# Patient Record
Sex: Male | Born: 1970 | Hispanic: Yes | Marital: Married | Smoking: Former smoker
Health system: Southern US, Community
[De-identification: ages and names within clinical notes are randomized; demographics above are authoritative.]

## PROBLEM LIST (undated history)

## (undated) ENCOUNTER — Emergency Department: Discharge status: Absent without leave | Payer: BLUE CROSS/BLUE SHIELD

## (undated) DIAGNOSIS — E119 Type 2 diabetes mellitus without complications: Secondary | ICD-10-CM

## (undated) DIAGNOSIS — I10 Essential (primary) hypertension: Secondary | ICD-10-CM

## (undated) HISTORY — DX: Essential (primary) hypertension: I10

## (undated) HISTORY — DX: Type 2 diabetes mellitus without complications: E11.9

---

## 2014-08-16 ENCOUNTER — Emergency Department: Payer: Self-pay | Admitting: Emergency Medicine

## 2017-07-20 ENCOUNTER — Ambulatory Visit: Payer: BLUE CROSS/BLUE SHIELD | Attending: Orthopaedic Surgery

## 2017-07-20 DIAGNOSIS — M25512 Pain in left shoulder: Secondary | ICD-10-CM | POA: Diagnosis present

## 2017-07-20 DIAGNOSIS — M25511 Pain in right shoulder: Secondary | ICD-10-CM | POA: Insufficient documentation

## 2017-07-20 DIAGNOSIS — M6281 Muscle weakness (generalized): Secondary | ICD-10-CM | POA: Diagnosis present

## 2017-07-20 DIAGNOSIS — M545 Low back pain: Secondary | ICD-10-CM | POA: Insufficient documentation

## 2017-07-20 DIAGNOSIS — M542 Cervicalgia: Secondary | ICD-10-CM | POA: Diagnosis present
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
## 2017-07-20 NOTE — Patient Instructions (Signed)
(  Home) Extension: Thoracic With Lumbar Lock - Sitting    Chair against the wall.  Sit with back against chair, knees bent, hands folded across (not shown). Extend trunk over chair back. Hold position for __5__ seconds. Repeat ___10  times per set. Do _3___ sets per session daily.   Copyright  VHI. All rights reserved.   La silla contra la pared Sientese con la espalda recostada en el espaldar de la silla, rodillas medio dobladas y las manos crusadas sobre los hombros. sostener la posicion por 5 segundos. repetir 10 veces y 3 seciones al dia.

## 2017-07-25 ENCOUNTER — Ambulatory Visit: Payer: BLUE CROSS/BLUE SHIELD

## 2017-07-25 DIAGNOSIS — M545 Low back pain: Secondary | ICD-10-CM

## 2017-07-25 DIAGNOSIS — M25512 Pain in left shoulder: Secondary | ICD-10-CM

## 2017-07-25 DIAGNOSIS — M6281 Muscle weakness (generalized): Secondary | ICD-10-CM

## 2017-07-25 DIAGNOSIS — M542 Cervicalgia: Secondary | ICD-10-CM

## 2017-07-25 DIAGNOSIS — M25511 Pain in right shoulder: Secondary | ICD-10-CM
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
## 2017-07-25 NOTE — Patient Instructions (Signed)
Scapular Retraction: Rowing (Eccentric) -    Hold end of band in each hand. Pull back until elbows are even with trunk. Hold for 5 seconds.  Use __yellow______ resistance band. _10__ reps per set, _3__ sets per day. Copyright  VHI. All rights reserved.     Sostenga la punta de la banda en cada mano. Jale hacia atras The St. Paul Travelershasta que los codos queden [parejos con el torso. Sostenga por 5 segundos. Use la banda de resistencia amarilla, repita 10 veces tres veces al dia.

## 2017-07-28 ENCOUNTER — Ambulatory Visit: Payer: BLUE CROSS/BLUE SHIELD

## 2017-07-28 DIAGNOSIS — M25512 Pain in left shoulder: Secondary | ICD-10-CM

## 2017-07-28 DIAGNOSIS — M545 Low back pain: Secondary | ICD-10-CM | POA: Diagnosis not present

## 2017-07-28 DIAGNOSIS — M542 Cervicalgia: Secondary | ICD-10-CM

## 2017-07-28 DIAGNOSIS — M6281 Muscle weakness (generalized): Secondary | ICD-10-CM

## 2017-07-28 DIAGNOSIS — M25511 Pain in right shoulder: Secondary | ICD-10-CM
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
## 2017-07-28 NOTE — Patient Instructions (Signed)
Abduction: Side Leg Lift (Eccentric) - Side-Lying    Lie on side. Lift top leg slightly higher than shoulder level. Keep top leg straight with body, toes pointing forward.  __10_ reps per set, __3_ sets per day, _5__ days per week.  http://ecce.exer.us/62   Copyright  VHI. All rights reserved.  Acuestese sobre un lado. Levante la pierna que esta derecha mas alto que el nivel del hombro. Mantenga la pierna que esta derecha alineada con su cuerpo. Los dedos del pie apuntando hacia afuera. Repita diez veces cada lado. Tres veces por dia , 5 veces por semana.

## 2017-08-01 ENCOUNTER — Ambulatory Visit: Payer: BLUE CROSS/BLUE SHIELD

## 2017-08-01 DIAGNOSIS — M545 Low back pain: Secondary | ICD-10-CM

## 2017-08-01 DIAGNOSIS — M6281 Muscle weakness (generalized): Secondary | ICD-10-CM

## 2017-08-01 DIAGNOSIS — M25511 Pain in right shoulder: Secondary | ICD-10-CM

## 2017-08-01 DIAGNOSIS — M25512 Pain in left shoulder: Secondary | ICD-10-CM

## 2017-08-01 DIAGNOSIS — M542 Cervicalgia: Secondary | ICD-10-CM
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
## 2017-08-01 NOTE — Patient Instructions (Signed)
Resisted External Rotation: in Neutral - Bilateral    Sit or stand, band in both hands, elbows at sides, bent to 90, forearms forward. Pinch shoulder blades together and rotate forearms out. Keep elbows at sides. Repeat _10___ times per set. Do _1___ sets per session. Do __3__ sessions per day.  http://orth.exer.us/966   Copyright  VHI. All rights reserved.   sientese or pare, con la Bed Bath [employer] Beyondbanda en ambas manos, con los codos a los lados y doblados a 90 grados, con los antebrazos hacia adelante.ponga las omoplatas juntas y ruede los antebrazos hacia afuera. mantenga los codos al lado. Repita 10 vezes por grupo. Un grupo por vez y 3 vezes al dia.

## 2017-08-03 ENCOUNTER — Emergency Department: Payer: BLUE CROSS/BLUE SHIELD | Admitting: Anesthesiology

## 2017-08-03 ENCOUNTER — Other Ambulatory Visit: Payer: Self-pay

## 2017-08-03 ENCOUNTER — Emergency Department: Payer: BLUE CROSS/BLUE SHIELD

## 2017-08-03 ENCOUNTER — Inpatient Hospital Stay
Admission: EM | Admit: 2017-08-03 | Discharge: 2017-08-04 | DRG: 030 | Disposition: A | Discharge status: Other Acute Inpatient Hospital | Payer: BLUE CROSS/BLUE SHIELD | Attending: Neurosurgery | Admitting: Neurosurgery

## 2017-08-03 ENCOUNTER — Encounter
Admission: EM | Disposition: A | Discharge status: Other Acute Inpatient Hospital | Payer: Self-pay | Source: Home / Self Care | Attending: Neurosurgery

## 2017-08-03 DIAGNOSIS — E119 Type 2 diabetes mellitus without complications: Secondary | ICD-10-CM | POA: Diagnosis present

## 2017-08-03 DIAGNOSIS — Z79899 Other long term (current) drug therapy: Secondary | ICD-10-CM

## 2017-08-03 DIAGNOSIS — G834 Cauda equina syndrome: Principal | ICD-10-CM | POA: Diagnosis present

## 2017-08-03 DIAGNOSIS — Z7984 Long term (current) use of oral hypoglycemic drugs: Secondary | ICD-10-CM | POA: Diagnosis not present

## 2017-08-03 DIAGNOSIS — Z87891 Personal history of nicotine dependence: Secondary | ICD-10-CM | POA: Diagnosis not present

## 2017-08-03 DIAGNOSIS — Z419 Encounter for procedure for purposes other than remedying health state, unspecified: Secondary | ICD-10-CM

## 2017-08-03 DIAGNOSIS — R339 Retention of urine, unspecified: Secondary | ICD-10-CM | POA: Diagnosis present

## 2017-08-03 DIAGNOSIS — I10 Essential (primary) hypertension: Secondary | ICD-10-CM | POA: Diagnosis present

## 2017-08-03 HISTORY — PX: LUMBAR LAMINECTOMY/DECOMPRESSION MICRODISCECTOMY: SHX5026

## 2017-08-03 LAB — CBC WITH DIFFERENTIAL/PLATELET
Basophils Absolute: 0.1 10*3/uL (ref 0–0.1)
Basophils Relative: 1 %
EOS ABS: 0 10*3/uL (ref 0–0.7)
Eosinophils Relative: 0 %
HEMATOCRIT: 41.9 % (ref 40.0–52.0)
HEMOGLOBIN: 13.8 g/dL (ref 13.0–18.0)
LYMPHS ABS: 1.1 10*3/uL (ref 1.0–3.6)
LYMPHS PCT: 18 %
MCH: 28.1 pg (ref 26.0–34.0)
MCHC: 32.8 g/dL (ref 32.0–36.0)
MCV: 85.5 fL (ref 80.0–100.0)
Monocytes Absolute: 0.5 10*3/uL (ref 0.2–1.0)
Monocytes Relative: 7 %
NEUTROS PCT: 74 %
Neutro Abs: 4.7 10*3/uL (ref 1.4–6.5)
Platelets: 187 10*3/uL (ref 150–440)
RBC: 4.9 MIL/uL (ref 4.40–5.90)
RDW: 13.5 % (ref 11.5–14.5)
WBC: 6.4 10*3/uL (ref 3.8–10.6)

## 2017-08-03 LAB — COMPREHENSIVE METABOLIC PANEL
ALT: 13 U/L — ABNORMAL LOW (ref 17–63)
AST: 23 U/L (ref 15–41)
Albumin: 4.3 g/dL (ref 3.5–5.0)
Alkaline Phosphatase: 52 U/L (ref 38–126)
Anion gap: 16 — ABNORMAL HIGH (ref 5–15)
BUN: 12 mg/dL (ref 6–20)
CHLORIDE: 95 mmol/L — AB (ref 101–111)
CO2: 23 mmol/L (ref 22–32)
Calcium: 8.9 mg/dL (ref 8.9–10.3)
Creatinine, Ser: 0.63 mg/dL (ref 0.61–1.24)
Glucose, Bld: 182 mg/dL — ABNORMAL HIGH (ref 65–99)
POTASSIUM: 3.8 mmol/L (ref 3.5–5.1)
SODIUM: 134 mmol/L — AB (ref 135–145)
Total Bilirubin: 1.4 mg/dL — ABNORMAL HIGH (ref 0.3–1.2)
Total Protein: 7.6 g/dL (ref 6.5–8.1)

## 2017-08-03 LAB — PROTIME-INR
INR: 1.04
PROTHROMBIN TIME: 13.5 s (ref 11.4–15.2)

## 2017-08-03 LAB — TYPE AND SCREEN
ABO/RH(D): A POS
Antibody Screen: NEGATIVE

## 2017-08-03 LAB — GLUCOSE, CAPILLARY
GLUCOSE-CAPILLARY: 173 mg/dL — AB (ref 65–99)
Glucose-Capillary: 155 mg/dL — ABNORMAL HIGH (ref 65–99)

## 2017-08-03 LAB — LACTIC ACID, PLASMA: Lactic Acid, Venous: 1.1 mmol/L (ref 0.5–1.9)

## 2017-08-03 LAB — SEDIMENTATION RATE: SED RATE: 6 mm/h (ref 0–15)

## 2017-08-03 LAB — C-REACTIVE PROTEIN

## 2017-08-03 SURGERY — LUMBAR LAMINECTOMY/DECOMPRESSION MICRODISCECTOMY 3 LEVELS
Anesthesia: General | Site: Back | Wound class: Clean

## 2017-08-03 MED ORDER — MIDAZOLAM HCL 2 MG/2ML IJ SOLN
INTRAMUSCULAR | Status: DC | PRN
  Administered 2017-08-03: 1 mg via INTRAVENOUS

## 2017-08-03 MED ORDER — KETOROLAC TROMETHAMINE 60 MG/2ML IM SOLN
60.0000 mg | Freq: Once | INTRAMUSCULAR | Status: AC
  Administered 2017-08-03: 60 mg via INTRAMUSCULAR
  Filled 2017-08-03: qty 2

## 2017-08-03 MED ORDER — METHOCARBAMOL 500 MG PO TABS
500.0000 mg | ORAL_TABLET | Freq: Four times a day (QID) | ORAL | Status: DC | PRN
  Filled 2017-08-03 (×2): qty 1

## 2017-08-03 MED ORDER — THROMBIN (RECOMBINANT) 5000 UNITS EX SOLR
CUTANEOUS | Status: DC | PRN
Start: 2017-08-03 — End: 2017-08-03
  Administered 2017-08-03: 5000 [IU] via TOPICAL

## 2017-08-03 MED ORDER — ROCURONIUM BROMIDE 100 MG/10ML IV SOLN
INTRAVENOUS | Status: DC | PRN
  Administered 2017-08-03: 10 mg via INTRAVENOUS
  Administered 2017-08-03: 40 mg via INTRAVENOUS
  Administered 2017-08-03: 20 mg via INTRAVENOUS

## 2017-08-03 MED ORDER — SODIUM CHLORIDE 0.9 % IV SOLN: 250.0000 mL | INTRAVENOUS | Status: DC

## 2017-08-03 MED ORDER — INSULIN ASPART 100 UNIT/ML ~~LOC~~ SOLN: 0.0000 [IU] | Freq: Three times a day (TID) | SUBCUTANEOUS | Status: DC

## 2017-08-03 MED ORDER — METFORMIN HCL 500 MG PO TABS
1000.0000 mg | ORAL_TABLET | Freq: Two times a day (BID) | ORAL | Status: DC
  Administered 2017-08-04 (×2): 1000 mg via ORAL
  Filled 2017-08-03 (×2): qty 2

## 2017-08-03 MED ORDER — MIDAZOLAM HCL 2 MG/2ML IJ SOLN
INTRAMUSCULAR | Status: AC
  Filled 2017-08-03: qty 2

## 2017-08-03 MED ORDER — ACETAMINOPHEN 325 MG PO TABS
650.0000 mg | ORAL_TABLET | ORAL | Status: DC | PRN
Start: 2017-08-03 — End: 2017-08-05

## 2017-08-03 MED ORDER — ONDANSETRON HCL 4 MG PO TABS: 4.0000 mg | ORAL_TABLET | Freq: Four times a day (QID) | ORAL | Status: DC | PRN

## 2017-08-03 MED ORDER — OXYCODONE HCL 5 MG PO TABS
10.0000 mg | ORAL_TABLET | ORAL | Status: DC | PRN
  Administered 2017-08-03: 10 mg via ORAL
  Filled 2017-08-03: qty 2

## 2017-08-03 MED ORDER — BUPIVACAINE-EPINEPHRINE (PF) 0.5% -1:200000 IJ SOLN
INTRAMUSCULAR | Status: DC | PRN
  Administered 2017-08-03: 20 mL

## 2017-08-03 MED ORDER — ONDANSETRON HCL 4 MG/2ML IJ SOLN: 4.0000 mg | Freq: Four times a day (QID) | INTRAMUSCULAR | Status: DC | PRN

## 2017-08-03 MED ORDER — LIDOCAINE HCL (CARDIAC) 20 MG/ML IV SOLN
INTRAVENOUS | Status: DC | PRN
  Administered 2017-08-03: 30 mg via INTRAVENOUS

## 2017-08-03 MED ORDER — FENTANYL CITRATE (PF) 100 MCG/2ML IJ SOLN
INTRAMUSCULAR | Status: AC
  Administered 2017-08-03: 25 ug via INTRAVENOUS
  Filled 2017-08-03: qty 2

## 2017-08-03 MED ORDER — SENNOSIDES-DOCUSATE SODIUM 8.6-50 MG PO TABS: 1.0000 | ORAL_TABLET | Freq: Every evening | ORAL | Status: DC | PRN

## 2017-08-03 MED ORDER — PHENOL 1.4 % MT LIQD
1.0000 | OROMUCOSAL | Status: DC | PRN
  Filled 2017-08-03: qty 177

## 2017-08-03 MED ORDER — OXYCODONE HCL 5 MG PO TABS
5.0000 mg | ORAL_TABLET | ORAL | Status: DC | PRN
  Administered 2017-08-04 (×3): 5 mg via ORAL
  Filled 2017-08-03 (×3): qty 1

## 2017-08-03 MED ORDER — ROCURONIUM BROMIDE 50 MG/5ML IV SOLN
INTRAVENOUS | Status: AC
  Filled 2017-08-03: qty 1

## 2017-08-03 MED ORDER — DIPHENHYDRAMINE HCL 25 MG PO CAPS
25.0000 mg | ORAL_CAPSULE | ORAL | Status: DC | PRN
  Administered 2017-08-04: 25 mg via ORAL
  Filled 2017-08-03 (×2): qty 1

## 2017-08-03 MED ORDER — ACETAMINOPHEN 500 MG PO TABS
1000.0000 mg | ORAL_TABLET | Freq: Four times a day (QID) | ORAL | Status: AC
  Administered 2017-08-04 (×4): 1000 mg via ORAL
  Filled 2017-08-03 (×4): qty 2

## 2017-08-03 MED ORDER — METHOCARBAMOL 1000 MG/10ML IJ SOLN
500.0000 mg | Freq: Four times a day (QID) | INTRAMUSCULAR | Status: DC | PRN
  Filled 2017-08-03: qty 5

## 2017-08-03 MED ORDER — ACETAMINOPHEN 650 MG RE SUPP: 650.0000 mg | RECTAL | Status: DC | PRN

## 2017-08-03 MED ORDER — SODIUM CHLORIDE 0.9 % IV SOLN
INTRAVENOUS | Status: DC | PRN
  Administered 2017-08-03: 30 ug/min via INTRAVENOUS

## 2017-08-03 MED ORDER — FENTANYL CITRATE (PF) 100 MCG/2ML IJ SOLN
INTRAMUSCULAR | Status: DC | PRN
  Administered 2017-08-03: 100 ug via INTRAVENOUS

## 2017-08-03 MED ORDER — PHENYLEPHRINE HCL 10 MG/ML IJ SOLN
INTRAMUSCULAR | Status: DC | PRN
  Administered 2017-08-03 (×3): 100 ug via INTRAVENOUS

## 2017-08-03 MED ORDER — PROPOFOL 10 MG/ML IV BOLUS
INTRAVENOUS | Status: DC | PRN
  Administered 2017-08-03: 150 mg via INTRAVENOUS

## 2017-08-03 MED ORDER — SODIUM CHLORIDE 0.9% FLUSH
3.0000 mL | Freq: Two times a day (BID) | INTRAVENOUS | Status: DC
  Administered 2017-08-03 – 2017-08-04 (×2): 3 mL via INTRAVENOUS

## 2017-08-03 MED ORDER — HYDROMORPHONE HCL 1 MG/ML IJ SOLN
1.0000 mg | Freq: Once | INTRAMUSCULAR | Status: AC
  Administered 2017-08-03: 1 mg via INTRAMUSCULAR
  Filled 2017-08-03: qty 1

## 2017-08-03 MED ORDER — HYDROMORPHONE HCL 1 MG/ML IJ SOLN
INTRAMUSCULAR | Status: AC
  Filled 2017-08-03: qty 1

## 2017-08-03 MED ORDER — GELATIN ABSORBABLE 12-7 MM EX MISC
CUTANEOUS | Status: DC | PRN
  Administered 2017-08-03: 3

## 2017-08-03 MED ORDER — INSULIN GLARGINE 100 UNIT/ML ~~LOC~~ SOLN
0.0000 [IU] | Freq: Every day | SUBCUTANEOUS | Status: DC
  Filled 2017-08-03: qty 0.2

## 2017-08-03 MED ORDER — HYDROMORPHONE HCL 1 MG/ML IJ SOLN
0.5000 mg | INTRAMUSCULAR | Status: DC | PRN
  Administered 2017-08-03 – 2017-08-04 (×4): 0.5 mg via INTRAVENOUS
  Filled 2017-08-03 (×3): qty 1

## 2017-08-03 MED ORDER — SUGAMMADEX SODIUM 200 MG/2ML IV SOLN
INTRAVENOUS | Status: DC | PRN
  Administered 2017-08-03: 120 mg via INTRAVENOUS

## 2017-08-03 MED ORDER — LABETALOL HCL 5 MG/ML IV SOLN
10.0000 mg | Freq: Once | INTRAVENOUS | Status: AC
  Administered 2017-08-03: 10 mg via INTRAVENOUS
  Filled 2017-08-03: qty 4

## 2017-08-03 MED ORDER — CEFAZOLIN SODIUM-DEXTROSE 1-4 GM/50ML-% IV SOLN
INTRAVENOUS | Status: DC | PRN
  Administered 2017-08-03: 1 g via INTRAVENOUS

## 2017-08-03 MED ORDER — BUPIVACAINE HCL (PF) 0.5 % IJ SOLN
INTRAMUSCULAR | Status: DC | PRN
  Administered 2017-08-03: 2 mL

## 2017-08-03 MED ORDER — MENTHOL 3 MG MT LOZG
1.0000 | LOZENGE | OROMUCOSAL | Status: DC | PRN
  Filled 2017-08-03: qty 9

## 2017-08-03 MED ORDER — METHYLPREDNISOLONE ACETATE 40 MG/ML IJ SUSP
INTRAMUSCULAR | Status: DC | PRN
  Administered 2017-08-03: 40 mg

## 2017-08-03 MED ORDER — ENALAPRIL MALEATE 2.5 MG PO TABS
2.5000 mg | ORAL_TABLET | Freq: Every day | ORAL | Status: DC
  Administered 2017-08-04: 2.5 mg via ORAL
  Filled 2017-08-03: qty 1

## 2017-08-03 MED ORDER — PROPOFOL 10 MG/ML IV BOLUS
INTRAVENOUS | Status: AC
  Filled 2017-08-03: qty 20

## 2017-08-03 MED ORDER — BACITRACIN 50000 UNITS IM SOLR
INTRAMUSCULAR | Status: DC | PRN
  Administered 2017-08-03: 50000 [IU]

## 2017-08-03 MED ORDER — DIPHENHYDRAMINE HCL 25 MG PO CAPS: 25.0000 mg | ORAL_CAPSULE | ORAL | Status: DC | PRN

## 2017-08-03 MED ORDER — SODIUM CHLORIDE 0.9 % IV SOLN
INTRAVENOUS | Status: DC
  Administered 2017-08-03: via INTRAVENOUS

## 2017-08-03 MED ORDER — SODIUM CHLORIDE 0.9% FLUSH: 3.0000 mL | INTRAVENOUS | Status: DC | PRN

## 2017-08-03 MED ORDER — CANAGLIFLOZIN 100 MG PO TABS
100.0000 mg | ORAL_TABLET | Freq: Every day | ORAL | Status: DC
  Filled 2017-08-03 (×2): qty 1

## 2017-08-03 MED ORDER — SODIUM CHLORIDE 0.9 % IJ SOLN
INTRAMUSCULAR | Status: DC | PRN
  Administered 2017-08-03: 20 mL

## 2017-08-03 MED ORDER — BUPIVACAINE LIPOSOME 1.3 % IJ SUSP
INTRAMUSCULAR | Status: DC | PRN
  Administered 2017-08-03: 20 mL

## 2017-08-03 MED ORDER — FENTANYL CITRATE (PF) 100 MCG/2ML IJ SOLN
25.0000 ug | INTRAMUSCULAR | Status: DC | PRN
  Administered 2017-08-03 (×4): 25 ug via INTRAVENOUS

## 2017-08-03 MED ORDER — LACTATED RINGERS IV SOLN
INTRAVENOUS | Status: DC | PRN
  Administered 2017-08-03 (×2): via INTRAVENOUS

## 2017-08-03 MED ORDER — FENTANYL CITRATE (PF) 250 MCG/5ML IJ SOLN
INTRAMUSCULAR | Status: AC
  Filled 2017-08-03: qty 5

## 2017-08-03 MED ORDER — ONDANSETRON HCL 4 MG/2ML IJ SOLN
INTRAMUSCULAR | Status: DC | PRN
  Administered 2017-08-03: 4 mg via INTRAVENOUS

## 2017-08-03 MED ORDER — ONDANSETRON HCL 4 MG/2ML IJ SOLN: 4.0000 mg | Freq: Once | INTRAMUSCULAR | Status: DC | PRN

## 2017-08-03 SURGICAL SUPPLY — 60 items
BLADE BOVIE TIP EXT 4 (BLADE) ×3 IMPLANT
BUR NEURO DRILL SOFT 3.0X3.8M (BURR) ×3 IMPLANT
CANISTER SUCT 1200ML W/VALVE (MISCELLANEOUS) ×6 IMPLANT
CHLORAPREP W/TINT 26ML (MISCELLANEOUS) ×3 IMPLANT
CNTNR SPEC 2.5X3XGRAD LEK (MISCELLANEOUS) ×1
CONT SPEC 4OZ STER OR WHT (MISCELLANEOUS) ×2
CONTAINER SPEC 2.5X3XGRAD LEK (MISCELLANEOUS) ×1 IMPLANT
COUNTER NEEDLE 20/40 LG (NEEDLE) ×3 IMPLANT
COVER LIGHT HANDLE STERIS (MISCELLANEOUS) ×6 IMPLANT
CUP MEDICINE 2OZ PLAST GRAD ST (MISCELLANEOUS) ×6 IMPLANT
DERMABOND ADVANCED (GAUZE/BANDAGES/DRESSINGS) ×2
DERMABOND ADVANCED .7 DNX12 (GAUZE/BANDAGES/DRESSINGS) ×1 IMPLANT
DRAPE C-ARM 42X72 X-RAY (DRAPES) ×6 IMPLANT
DRAPE LAPAROTOMY 100X77 ABD (DRAPES) ×3 IMPLANT
DRAPE MICROSCOPE SPINE 48X150 (DRAPES) ×3 IMPLANT
DRAPE POUCH INSTRU U-SHP 10X18 (DRAPES) ×3 IMPLANT
DRAPE SURG 17X11 SM STRL (DRAPES) ×12 IMPLANT
DRSG TEGADERM 4X4.75 (GAUZE/BANDAGES/DRESSINGS) ×3 IMPLANT
DRSG TELFA 4X3 1S NADH ST (GAUZE/BANDAGES/DRESSINGS) ×3 IMPLANT
ELECT CAUTERY BLADE TIP 2.5 (TIP) ×3
ELECT EZSTD 165MM 6.5IN (MISCELLANEOUS) ×3
ELECTRODE CAUTERY BLDE TIP 2.5 (TIP) ×1 IMPLANT
ELECTRODE EZSTD 165MM 6.5IN (MISCELLANEOUS) ×1 IMPLANT
EVICEL AIRLESS SPRAY ACCES (MISCELLANEOUS) IMPLANT
FRAME EYE SHIELD (PROTECTIVE WEAR) ×3 IMPLANT
GLOVE BIOGEL M 6.5 STRL (GLOVE) ×6 IMPLANT
GLOVE BIOGEL PI IND STRL 7.0 (GLOVE) ×2 IMPLANT
GLOVE BIOGEL PI INDICATOR 7.0 (GLOVE) ×4
GLOVE SURG SYN 8.5  E (GLOVE) ×6
GLOVE SURG SYN 8.5 E (GLOVE) ×3 IMPLANT
GOWN SRG XL LVL 3 NONREINFORCE (GOWNS) ×1 IMPLANT
GOWN STRL NON-REIN TWL XL LVL3 (GOWNS) ×2
GOWN STRL REUS W/ TWL LRG LVL3 (GOWN DISPOSABLE) ×1 IMPLANT
GOWN STRL REUS W/TWL LRG LVL3 (GOWN DISPOSABLE) ×2
GRADUATE 1200CC STRL 31836 (MISCELLANEOUS) ×3 IMPLANT
KIT SPINAL PRONEVIEW (KITS) ×3 IMPLANT
KNIFE BAYONET SHORT DISCETOMY (MISCELLANEOUS) IMPLANT
MARKER SKIN DUAL TIP RULER LAB (MISCELLANEOUS) ×9 IMPLANT
NDL SAFETY ECLIPSE 18X1.5 (NEEDLE) ×1 IMPLANT
NEEDLE HYPO 18GX1.5 SHARP (NEEDLE) ×2
NEEDLE HYPO 22GX1.5 SAFETY (NEEDLE) ×3 IMPLANT
NS IRRIG 1000ML POUR BTL (IV SOLUTION) ×3 IMPLANT
PACK LAMINECTOMY NEURO (CUSTOM PROCEDURE TRAY) ×3 IMPLANT
PAD ARMBOARD 7.5X6 YLW CONV (MISCELLANEOUS) ×3 IMPLANT
PATTIES SURGICAL .5X1.5 (GAUZE/BANDAGES/DRESSINGS) IMPLANT
SPOGE SURGIFLO 8M (HEMOSTASIS) ×2
SPONGE SURGIFLO 8M (HEMOSTASIS) ×1 IMPLANT
STAPLER SKIN PROX 35W (STAPLE) IMPLANT
SUT DVC VLOC 3-0 CL 6 P-12 (SUTURE) ×3 IMPLANT
SUT NURALON 4 0 TR CR/8 (SUTURE) IMPLANT
SUT VIC AB 0 CT1 27 (SUTURE)
SUT VIC AB 0 CT1 27XCR 8 STRN (SUTURE) IMPLANT
SUT VIC AB 2-0 CT1 18 (SUTURE) ×9 IMPLANT
SUT VICRYL 0 AB UR-6 (SUTURE) ×6 IMPLANT
SYR 10ML LL (SYRINGE) ×3 IMPLANT
SYR 20CC LL (SYRINGE) ×3 IMPLANT
SYR 3ML LL SCALE MARK (SYRINGE) ×3 IMPLANT
TOWEL OR 17X26 4PK STRL BLUE (TOWEL DISPOSABLE) ×12 IMPLANT
TUBING CONNECTING 10 (TUBING) ×2 IMPLANT
TUBING CONNECTING 10' (TUBING) ×1

## 2017-08-03 NOTE — ED Notes (Addendum)
Report to Melody in OR.

## 2017-08-03 NOTE — Anesthesia Preprocedure Evaluation (Signed)
Anesthesia Evaluation  Patient identified by MRN, date of birth, ID band Patient awake    Reviewed: Allergy & Precautions, NPO status , Patient's Chart, lab work & pertinent test results  History of Anesthesia Complications Negative for: history of anesthetic complications  Airway Mallampati: II       Dental   Pulmonary neg sleep apnea, neg COPD, former smoker,           Cardiovascular hypertension, Pt. on medications (-) Past MI and (-) CHF (-) dysrhythmias (-) Valvular Problems/Murmurs     Neuro/Psych neg Seizures    GI/Hepatic Neg liver ROS, neg GERD  ,  Endo/Other  diabetes, Type 2, Oral Hypoglycemic Agents  Renal/GU negative Renal ROS     Musculoskeletal   Abdominal   Peds  Hematology   Anesthesia Other Findings   Reproductive/Obstetrics                             Anesthesia Physical Anesthesia Plan  ASA: III and emergent  Anesthesia Plan: General   Post-op Pain Management:    Induction: Intravenous  PONV Risk Score and Plan:   Airway Management Planned: Oral ETT  Additional Equipment:   Intra-op Plan:   Post-operative Plan:   Informed Consent: I have reviewed the patients History and Physical, chart, labs and discussed the procedure including the risks, benefits and alternatives for the proposed anesthesia with the patient or authorized representative who has indicated his/her understanding and acceptance.     Plan Discussed with:   Anesthesia Plan Comments:         Anesthesia Quick Evaluation

## 2017-08-03 NOTE — Anesthesia Procedure Notes (Signed)
Procedure Name: Intubation Date/Time: 08/03/2017 6:52 PM Performed by: Lendon Colonel, CRNA Pre-anesthesia Checklist: Patient identified, Patient being monitored, Timeout performed, Emergency Drugs available and Suction available Patient Re-evaluated:Patient Re-evaluated prior to induction Oxygen Delivery Method: Circle system utilized Preoxygenation: Pre-oxygenation with 100% oxygen Induction Type: IV induction Ventilation: Mask ventilation without difficulty Laryngoscope Size: Mac and 3 Grade View: Grade I Tube type: Oral Tube size: 7.0 mm Number of attempts: 1 Airway Equipment and Method: Stylet Placement Confirmation: ETT inserted through vocal cords under direct vision,  positive ETCO2 and breath sounds checked- equal and bilateral Secured at: 19 cm Tube secured with: Tape Dental Injury: Teeth and Oropharynx as per pre-operative assessment

## 2017-08-03 NOTE — ED Notes (Signed)
Patient transported to MRI 

## 2017-08-03 NOTE — ED Notes (Addendum)
Dr. Venetia Nighthester Yarbrough and Ivar DrapeAmanda Ferri, PA  at bedside to see patient. Interpreter paged. Son assisted patient in understanding conversation.   Dr. Myer HaffYarbrough and  Ivar DrapeAmanda Ferri, PA and patient aware that interpreter has been paged. Will await consent until conversation occurs with interpreter at bedside.

## 2017-08-03 NOTE — ED Notes (Addendum)
Interpreter requested 
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
## 2017-08-03 NOTE — Anesthesia Post-op Follow-up Note (Signed)
Anesthesia QCDR form completed.        

## 2017-08-03 NOTE — ED Notes (Signed)
EDP and interpreter at bedside.

## 2017-08-03 NOTE — ED Notes (Signed)
NPO since 11AM.

## 2017-08-03 NOTE — Transfer of Care (Signed)
Immediate Anesthesia Transfer of Care Note  Patient: Adrian Taylor  Procedure(s) Performed: LUMBAR LAMINECTOMY/DECOMPRESSION MICRODISCECTOMY L1-4 (N/A Back)  Patient Location: PACU  Anesthesia Type:General  Level of Consciousness: sedated  Airway & Oxygen Therapy: Patient Spontanous Breathing and Patient connected to face mask oxygen  Post-op Assessment: Report given to RN and Post -op Vital signs reviewed and stable  Post vital signs: Reviewed and stable  Last Vitals:  Vitals:   08/03/17 1730 08/03/17 2140  BP: 126/90 (!) 148/93  Pulse: 96 85  Resp: 13 16  Temp:  (!) 36.3 C  SpO2: 100% 100%    Last Pain:  Vitals:   08/03/17 1730  TempSrc:   PainSc: 0-No pain         Complications: No apparent anesthesia complications

## 2017-08-03 NOTE — Op Note (Signed)
Indications: [name] is a 47 yo male who presented with cauda equina syndrome with compressive lesion dorsal to the spinal cord.  After reviewing his options, I recommended urgent surgical decompression.  Findings: epidural compression likely due to hematoma  Preoperative Diagnosis: Cauda Equina Syndrome Postoperative Diagnosis: same   EBL: 100 ml IVF: 1100 ml Drains: none Disposition: Extubated and Stable to PACU Complications: none  Foley catheter was placed in ER.   Preoperative Note:   Risks of surgery discussed include: infection, bleeding, stroke, coma, death, paralysis, CSF leak, nerve/spinal cord injury, numbness, tingling, weakness, complex regional pain syndrome, recurrent stenosis and/or disc herniation, vascular injury, development of instability, neck/back pain, need for further surgery, persistent symptoms, development of deformity, and the risks of anesthesia. The patient understood these risks and agreed to proceed.  Operative Note:    The patient was then brought from the preoperative center with intravenous access established.  The patient underwent general anesthesia and endotracheal tube intubation, then was rotated on the Wilson Frame where all pressure points were appropriately padded.  An incision was marked with flouroscopy. The skin was then thoroughly cleansed.  Perioperative antibiotic prophylaxis were held until cultures were taken, then given.  Sterile prep and drapes were then applied and a timeout was then observed.    Once this was complete, the incision was made with the use of a #10 blade knife.  The paraspinus muscles were subperiosteally dissected until the facets from L1-2 to L3-4 were visualized. Flouroscopy was used to confirm the level. A self-retaining retractor was placed.  The microscope was then sterilely brought into the field. Using a high speed drill, trough laminectomies were drilled at L2 and L3 to remove the entire L2 and L3 laminae.   The inferior 2/3 of the L1 lamina was also egg-shelled with the drill and removed.  The superior 1/3 of the L4 lamina was similarly egg-shelled and removed. The ligamentum was then carefully removed with 2mm Kerrison punch and rongeurs from the mid-L1 level to the mid-L4 level.  Using the 2 and 3mm Kerrison punches, bilateral medial facetectomies and foraminotomies were performed to ensure decompression of the nerve roots.    After removal of the ligaments, a substance with the appearance of acutely coagulated blood was noted in the epidural space.  As the soft tissues and apparent blood clot were removed, tissue was sent to pathology.  Culture swabs were also performed to rule out infection.    Using Kerrison punches and dissectors, the vast majority of the compressive blood clot was removed.  The dura was then visualized throughout the area of the the blood clot.  The [place]field 4 was used to confirm that the dural sac was decompressed.  Once the thecal sac and nerve root were noted to be relaxed and under less tension the ball-tipped feeler and [facility] elevator were passed along the foramina to ensure no residual compression was noted.  With none noted, attention was then turned to closure.  A Depo-Medrol soaked Gelfoam pledget was placed in the epidural space, then irrigated free.   Hemostasis was confirmed after removal of the gelfoam. The retractor was then removed and hemostasis achieved in the superficial tissues. The wound was copiously irrigated. A drain was placed.  The fascial layer was reapproximated with the use of a 0- Vicryl suture.  Subcutaneous tissue layer was reapproximated using 2-0 Vicryl suture.  The skin was then cleansed and staples were used to close the skin opening.  Patient was then rotated  back to the preoperative bed awakened from anesthesia and taken to recovery all counts are correct in this case.  I performed the entire procedure with the assistance of Ivar Drape PA as  an Designer, television/film set.   Venetia Night MD
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
## 2017-08-03 NOTE — ED Triage Notes (Signed)
Pt states about 1 hr PTA onset of low back  Pain. Denies urinary symptoms. Denies injury to back. Appears very uncomfortable in triage. Denies abd or flank pain. Family member states [facility] and was prescribed anti-inflammatory medication afterward.

## 2017-08-03 NOTE — ED Notes (Addendum)
Pt lying flat in stretcher per order by EDP. Pt states he did not take his BP medication this AM. Ok to have pillow, under head now.

## 2017-08-03 NOTE — ED Notes (Signed)
Taken to OR by orderly. All of belongings sent with wife.

## 2017-08-04 ENCOUNTER — Ambulatory Visit (HOSPITAL_COMMUNITY)
Admission: AD | Admit: 2017-08-04 | Discharge: 2017-08-04 | Disposition: A | Discharge status: Home / Self Care | Payer: BLUE CROSS/BLUE SHIELD | Source: Other Acute Inpatient Hospital | Attending: Neurosurgery | Admitting: Neurosurgery

## 2017-08-04 ENCOUNTER — Other Ambulatory Visit: Payer: Self-pay

## 2017-08-04 ENCOUNTER — Ambulatory Visit: Payer: BLUE CROSS/BLUE SHIELD

## 2017-08-04 ENCOUNTER — Encounter: Payer: Self-pay | Admitting: Neurosurgery

## 2017-08-04 DIAGNOSIS — G834 Cauda equina syndrome: Secondary | ICD-10-CM | POA: Insufficient documentation

## 2017-08-04 LAB — GLUCOSE, CAPILLARY
GLUCOSE-CAPILLARY: 132 mg/dL — AB (ref 65–99)
Glucose-Capillary: 173 mg/dL — ABNORMAL HIGH (ref 65–99)
Glucose-Capillary: 170 mg/dL — ABNORMAL HIGH (ref 65–99)
Glucose-Capillary: 148 mg/dL — ABNORMAL HIGH (ref 65–99)
Glucose-Capillary: 236 mg/dL — ABNORMAL HIGH (ref 65–99)
Glucose-Capillary: 269 mg/dL — ABNORMAL HIGH (ref 65–99)
Glucose-Capillary: 316 mg/dL — ABNORMAL HIGH (ref 65–99)

## 2017-08-04 LAB — MRSA PCR SCREENING: MRSA by PCR: NEGATIVE

## 2017-08-04 LAB — LACTIC ACID, PLASMA
LACTIC ACID, VENOUS: 1.5 mmol/L (ref 0.5–1.9)
Lactic Acid, Venous: 2.2 mmol/L (ref 0.5–1.9)

## 2017-08-04 MED ORDER — OXYCODONE HCL 10 MG PO TABS: 10.0000 mg | ORAL_TABLET | ORAL | 0 refills | Status: DC | PRN

## 2017-08-04 MED ORDER — ENALAPRIL MALEATE 2.5 MG PO TABS: 2.5000 mg | ORAL_TABLET | Freq: Every day | ORAL | Status: DC

## 2017-08-04 MED ORDER — SENNOSIDES-DOCUSATE SODIUM 8.6-50 MG PO TABS: 1.0000 | ORAL_TABLET | Freq: Every evening | ORAL | Status: DC | PRN

## 2017-08-04 MED ORDER — HYDROMORPHONE HCL 1 MG/ML IJ SOLN: 0.5000 mg | INTRAMUSCULAR | 0 refills | Status: DC | PRN

## 2017-08-04 MED ORDER — INSULIN ASPART 100 UNIT/ML ~~LOC~~ SOLN
0.0000 [IU] | SUBCUTANEOUS | Status: DC
  Administered 2017-08-04: 5 [IU] via SUBCUTANEOUS
  Administered 2017-08-04: 2 [IU] via SUBCUTANEOUS
  Administered 2017-08-04: 11 [IU] via SUBCUTANEOUS
  Administered 2017-08-04: 3 [IU] via SUBCUTANEOUS
  Administered 2017-08-04: 8 [IU] via SUBCUTANEOUS
  Administered 2017-08-04: 2 [IU] via SUBCUTANEOUS
  Filled 2017-08-04 (×6): qty 1

## 2017-08-04 MED ORDER — METHOCARBAMOL 500 MG PO TABS: 500.0000 mg | ORAL_TABLET | Freq: Four times a day (QID) | ORAL | Status: DC | PRN

## 2017-08-04 MED ORDER — ACETAMINOPHEN 650 MG RE SUPP: 650.0000 mg | RECTAL | 0 refills | Status: DC | PRN

## 2017-08-04 MED ORDER — OXYCODONE HCL 5 MG PO TABS: 5.0000 mg | ORAL_TABLET | ORAL | 0 refills | Status: DC | PRN

## 2017-08-04 NOTE — Progress Notes (Signed)
[facility] doctor called via phone regarding elevated lactic acid. No new intervention at this time. Repeat lactic acid to be redrawn in the am.

## 2017-08-04 NOTE — Progress Notes (Addendum)
Inpatient Diabetes Program Recommendations  AACE/ADA: New Consensus Statement on Inpatient Glycemic Control (2015)  Target Ranges:  Prepandial:   less than 140 mg/dL      Peak postprandial:   less than 180 mg/dL (1-2 hours)      Critically ill patients:  140 - 180 mg/dL   Lab Results  Component Value Date   GLUCAP 132 (H) 08/04/2017    Review of Glycemic Control Results for Adrian Taylor, Curran G (MRN [id number]) as of 08/04/2017 08:19  Ref. Range 08/03/2017 21:58 08/03/2017 23:09 08/04/2017 01:09 08/04/2017 03:26 08/04/2017 07:26  Glucose-Capillary Latest Ref Range: 65 - 99 mg/dL 409173 (H) 811155 (H) 914170 (H) 148 (H) 132 (H)     Diabetes history: Type 2 Outpatient Diabetes medications: Glucophage 1000mg  bid, Lantus 0-20 units qhs, Jardience 10mg  qday Current orders for Inpatient glycemic control: Invokana 100mg  qday, Novolog 0-15 units q4h, Lantus 0-20 units qhs, Metformin 1000mg  bid  Inpatient Diabetes Program Recommendations:   Lantus insulin was held last night because it is "on hold" with a comment, "patient is NPO"- please assess appropriateness of the hold- consider restarting Lantus tonight.  If he tolerates food, Novolog correction insulin will need to be switched to tid and add Novolog 0-5 units qhs.  Invokana is unavailable in hospital.   Susette RacerJulie Alen Matheson, RN, BA, MHA, CDE Diabetes Coordinator Inpatient Diabetes Program  [phone] (Team Pager) [phone] [facility] Office) 08/04/2017 9:40 AM

## 2017-08-04 NOTE — Progress Notes (Signed)
Adrian CordialAmanada Taylor in to see patient. Advised PA regarding insertion site swelling surrounding it. I outlined swelling with marker. No new orders given at this time except to maintain map greater then 80. Holding vasotec 2.5 mg.

## 2017-08-04 NOTE — Care Management (Signed)
RNCM spoke with Dr. Myer HaffYarbrough to see if any assistance was needed [facility].  Dr. Jolayne PantherKeith Dombrowski [facility].

## 2017-08-04 NOTE — Progress Notes (Signed)
  History: Adrian Taylor is POD1 s/p L1-L4  decompression for cauda equina syndrome. Tolerated procedure well without complication. States he is feeling much better since surgery but has not yet ambulated or voided. Complains of intermittent numbness on the bottom of his feet but does not extend into his lower extremities. Feels as if strength has improved. Back pain rated 3-4/10.  Denies extremity pain and saddle paresthesia.    Physical Exam: Vitals:   08/04/17 0800 08/04/17 0900  BP: 123/76 (!) 137/58  Pulse: 91 100  Resp: 11 14  Temp:    SpO2: 100% 100%    AA Ox3 CNI Skin: Incision site dressed. Bandage wet with scant blood. Edema at superior aspect of incision site. Tenderness around drainage site.  Strength:4+/5 lower extremities. Sensation intact and symmetric except plantar surface of feet.   Data:  Recent Labs  Lab 08/03/17 1541  NA 134*  K 3.8  CL 95*  CO2 23  BUN 12  CREATININE 0.63  GLUCOSE 182*  CALCIUM 8.9   Recent Labs  Lab 08/03/17 1541  AST 23  ALT 13*  ALKPHOS 52     Recent Labs  Lab 08/03/17 1541  WBC 6.4  HGB 13.8  HCT 41.9  PLT 187   Recent Labs  Lab 08/03/17 1541  INR 1.04         Assessment/Plan:  Adrian Taylor is recovering well s/p L1-L4 open decompression for cauda equina syndrome. Symptoms have greatly improved since surgery. Patient has been able to eat without issue but has not yet ambulated or voided.  Drain output 100  Transfer to [facility] in progress for neurosurgery coverage and vascular imaging. Maintain drain Maintain MAP > 80 Dressing change  Ivar DrapeAmanda Deanne Bedgood PA-C Department of Neurosurgery
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
## 2017-08-04 NOTE — Progress Notes (Signed)
Report given to CARELINK 

## 2017-08-04 NOTE — Progress Notes (Addendum)
   08/04/17 1100  Clinical Encounter Type  Visited With Patient;Other (Comment) (interpreter present)  Visit Type Initial  Referral From Nurse  Consult/Referral To [place] received order for prayer with patient; requested interpreter.  Met with patient and interpreter to pray with patient.

## 2017-08-04 NOTE — Progress Notes (Signed)
Report given to care-link [phone] [facility] and [facility] [phone]. [facility]. [facility] [address] [place]ice [place] [phone]. Room #8 E 14. Patient alert and oriented, on room air. Neuro checks have been performed q2 throughout the day. Please review flow sheet. JP dressing changed performed by PA this am. No drainage output noted during shift. Scant blood drainage. Patient tolerating carb modified diet. Using urinal with no complications. Precautions- no bending, twisting, or arching. Patient has had some aching, discomfort to back. Please review chart.

## 2017-08-04 NOTE — Anesthesia Postprocedure Evaluation (Signed)
Anesthesia Post Note  Patient: Adrian BumpersJose G Villeda Taylor  Procedure(s) Performed: LUMBAR LAMINECTOMY/DECOMPRESSION MICRODISCECTOMY L1-4 (N/A Back)  Patient location during evaluation: PACU Anesthesia Type: General Level of consciousness: awake and alert Pain management: pain level controlled Vital Signs Assessment: post-procedure vital signs reviewed and stable Respiratory status: spontaneous breathing, nonlabored ventilation and respiratory function stable Cardiovascular status: blood pressure returned to baseline and stable Postop Assessment: no apparent nausea or vomiting Anesthetic complications: no     Last Vitals:  Vitals:   08/04/17 0500 08/04/17 0600  BP: 115/78 110/68  Pulse: 96 91  Resp: 11 11  Temp:    SpO2: 99% 98%    Last Pain:  Vitals:   08/04/17 0430  TempSrc:   PainSc: Asleep                 Jules SchickLogan,  Rhys Anchondo P

## 2017-08-04 NOTE — Progress Notes (Signed)
Per Dr. Myer HaffYarbrough [facility]

## 2017-08-04 NOTE — Discharge Summary (Signed)
Physician Discharge Summary  Patient ID: Adrian Taylor MRN: [id number] DOB/AGE: Jul 12, 1970 47 y.o.  Admit date: 08/03/2017 Discharge date: 08/04/2017  Admission Diagnoses: Cauda Equina Syndrome  Discharge Diagnoses:  Active Problems:   Cauda equina compression [facility](HCC)   Discharged Condition: Stable but critical  Hospital Course:   Adrian Taylor is a 47 yo male who presented with cauda equina syndrome of 1 day duration.  He had a epidural fluid collection from L1-4 that required operative intervention via L1-4 decompression.   He was admitted to the intensive care unit where his blood pressure was monitored.  He showed improvement in neurological function.  His foley catheter was removed on POD1, and he was able to urinate.  His sensory deficits have improved.  Pathology and microbiology studies are pending.  Consults: pulmonary/intensive care  Significant Diagnostic Studies: radiology: MRI: large compressive epidural collection from L1 to L4  Treatments: surgery: L1-4 open lumbar decompression on 08/03/17  Discharge Exam: Blood pressure (!) 159/97, pulse 99, temperature 97.9 F (36.6 C), resp. rate 10, height 4\' 11"  (1.499 m), weight 48.9 kg (107 lb 12.9 oz), SpO2 97 %. General appearance: alert, cooperative and appears stated age   AAOx3 CNI 5/5 throughout bilateral upper and lower extremities Sensation is normal in BUE, and slightly diminished in BLE  Dressing CDI  Disposition: Final discharge disposition not confirmed   [facility]      Signed: Venetia NightCHESTER Georgiann Neider 08/04/2017, 12:23 PM

## 2017-08-04 NOTE — Progress Notes (Signed)
Vasotec given due to map of 104.

## 2017-08-05 LAB — SURGICAL PATHOLOGY

## 2017-08-08 LAB — CULTURE, BLOOD (ROUTINE X 2)
CULTURE: NO GROWTH
Culture: NO GROWTH
Special Requests: ADEQUATE

## 2017-08-09 ENCOUNTER — Ambulatory Visit: Payer: BLUE CROSS/BLUE SHIELD

## 2017-08-09 LAB — AEROBIC/ANAEROBIC CULTURE W GRAM STAIN (SURGICAL/DEEP WOUND)

## 2017-08-09 LAB — AEROBIC/ANAEROBIC CULTURE (SURGICAL/DEEP WOUND): CULTURE: NO GROWTH

## 2018-01-12 ENCOUNTER — Other Ambulatory Visit
Admission: AD | Admit: 2018-01-12 | Discharge: 2018-01-12 | Disposition: A | Discharge status: Home / Self Care | Attending: Family Medicine | Admitting: Family Medicine

## 2018-01-12 NOTE — ED Notes (Signed)
Patient ambulatory to triage with steady gait, without difficulty or distress noted, in custody of    [place] [employer] officer Selena Batten for forensic blood draw; pt A&Ox3, with no c/o voiced and denies need to see ED provider; pt voices good understanding of blood draw to be performed for forensic testing & consent signed ; pt verifies identity with name and DOB; using sealed kit provided by officer, tourniquet applied to left upper arm; left antecubital region prepped with betadine swab and allowed to dry completely; needle inserted and 2 grey top blood tubes collected; tourniquet removed, needle removed & intact, dressing applied; tubes labeled, given to officer and placed in sealed container using chain of custody; pt tolerated well and continues to deny c/o or need to see ED provider; pt d/c in police custody

## 2019-03-16 ENCOUNTER — Inpatient Hospital Stay
Admission: EM | Admit: 2019-03-16 | Discharge: 2019-03-20 | DRG: 988 | Disposition: A | Discharge status: Home Health | Payer: BC Managed Care – PPO | Attending: Internal Medicine | Admitting: Internal Medicine

## 2019-03-16 ENCOUNTER — Other Ambulatory Visit: Payer: Self-pay

## 2019-03-16 ENCOUNTER — Encounter: Payer: Self-pay | Admitting: Emergency Medicine

## 2019-03-16 DIAGNOSIS — E1165 Type 2 diabetes mellitus with hyperglycemia: Secondary | ICD-10-CM | POA: Diagnosis present

## 2019-03-16 DIAGNOSIS — S91331A Puncture wound without foreign body, right foot, initial encounter: Secondary | ICD-10-CM | POA: Diagnosis present

## 2019-03-16 DIAGNOSIS — M00871 Arthritis due to other bacteria, right ankle and foot: Secondary | ICD-10-CM | POA: Diagnosis present

## 2019-03-16 DIAGNOSIS — E11628 Type 2 diabetes mellitus with other skin complications: Secondary | ICD-10-CM | POA: Diagnosis not present

## 2019-03-16 DIAGNOSIS — Z794 Long term (current) use of insulin: Secondary | ICD-10-CM

## 2019-03-16 DIAGNOSIS — X58XXXS Exposure to other specified factors, sequela: Secondary | ICD-10-CM | POA: Diagnosis not present

## 2019-03-16 DIAGNOSIS — I10 Essential (primary) hypertension: Secondary | ICD-10-CM | POA: Diagnosis present

## 2019-03-16 DIAGNOSIS — E1169 Type 2 diabetes mellitus with other specified complication: Principal | ICD-10-CM | POA: Diagnosis present

## 2019-03-16 DIAGNOSIS — L97516 Non-pressure chronic ulcer of other part of right foot with bone involvement without evidence of necrosis: Secondary | ICD-10-CM | POA: Diagnosis present

## 2019-03-16 DIAGNOSIS — M869 Osteomyelitis, unspecified: Secondary | ICD-10-CM | POA: Diagnosis present

## 2019-03-16 DIAGNOSIS — W268XXA Contact with other sharp object(s), not elsewhere classified, initial encounter: Secondary | ICD-10-CM

## 2019-03-16 DIAGNOSIS — E871 Hypo-osmolality and hyponatremia: Secondary | ICD-10-CM | POA: Diagnosis present

## 2019-03-16 DIAGNOSIS — Y990 Civilian activity done for income or pay: Secondary | ICD-10-CM | POA: Diagnosis not present

## 2019-03-16 DIAGNOSIS — L089 Local infection of the skin and subcutaneous tissue, unspecified: Secondary | ICD-10-CM | POA: Diagnosis not present

## 2019-03-16 DIAGNOSIS — L02611 Cutaneous abscess of right foot: Secondary | ICD-10-CM | POA: Diagnosis present

## 2019-03-16 DIAGNOSIS — T1490XS Injury, unspecified, sequela: Secondary | ICD-10-CM | POA: Diagnosis not present

## 2019-03-16 DIAGNOSIS — Z87891 Personal history of nicotine dependence: Secondary | ICD-10-CM | POA: Diagnosis not present

## 2019-03-16 DIAGNOSIS — S91339A Puncture wound without foreign body, unspecified foot, initial encounter: Secondary | ICD-10-CM

## 2019-03-16 DIAGNOSIS — S91341A Puncture wound with foreign body, right foot, initial encounter: Secondary | ICD-10-CM | POA: Diagnosis not present

## 2019-03-16 DIAGNOSIS — E11621 Type 2 diabetes mellitus with foot ulcer: Secondary | ICD-10-CM | POA: Diagnosis present

## 2019-03-16 DIAGNOSIS — Z20828 Contact with and (suspected) exposure to other viral communicable diseases: Secondary | ICD-10-CM | POA: Diagnosis present

## 2019-03-16 DIAGNOSIS — W228XXA Striking against or struck by other objects, initial encounter: Secondary | ICD-10-CM | POA: Diagnosis not present

## 2019-03-16 DIAGNOSIS — L03115 Cellulitis of right lower limb: Secondary | ICD-10-CM | POA: Diagnosis present

## 2019-03-16 DIAGNOSIS — L0390 Cellulitis, unspecified: Secondary | ICD-10-CM | POA: Diagnosis present

## 2019-03-16 DIAGNOSIS — G834 Cauda equina syndrome: Secondary | ICD-10-CM | POA: Diagnosis not present

## 2019-03-16 DIAGNOSIS — Z79899 Other long term (current) drug therapy: Secondary | ICD-10-CM

## 2019-03-16 LAB — HEMOGLOBIN A1C
Hgb A1c MFr Bld: 14.6 % — ABNORMAL HIGH (ref 4.8–5.6)
Mean Plasma Glucose: 372.32 mg/dL

## 2019-03-16 LAB — COMPREHENSIVE METABOLIC PANEL
ALT: 10 U/L (ref 0–44)
AST: 15 U/L (ref 15–41)
Albumin: 3.1 g/dL — ABNORMAL LOW (ref 3.5–5.0)
Alkaline Phosphatase: 60 U/L (ref 38–126)
Anion gap: 11 (ref 5–15)
BUN: 18 mg/dL (ref 6–20)
CO2: 20 mmol/L — ABNORMAL LOW (ref 22–32)
Calcium: 8.1 mg/dL — ABNORMAL LOW (ref 8.9–10.3)
Chloride: 96 mmol/L — ABNORMAL LOW (ref 98–111)
Creatinine, Ser: 1.01 mg/dL (ref 0.61–1.24)
GFR calc Af Amer: >60 mL/min (ref >60)
GFR calc non Af Amer: >60 mL/min (ref >60)
Glucose, Bld: 466 mg/dL — ABNORMAL HIGH (ref 70–99)
Potassium: 4.8 mmol/L (ref 3.5–5.1)
Sodium: 127 mmol/L — ABNORMAL LOW (ref 135–145)
Total Bilirubin: 0.7 mg/dL (ref 0.3–1.2)
Total Protein: 6.6 g/dL (ref 6.5–8.1)

## 2019-03-16 LAB — LACTIC ACID, PLASMA: Lactic Acid, Venous: 1.6 mmol/L (ref 0.5–1.9)

## 2019-03-16 LAB — CBC WITH DIFFERENTIAL/PLATELET
Abs Immature Granulocytes: 0.05 10*3/uL (ref 0.00–0.07)
Basophils Absolute: 0 10*3/uL (ref 0.0–0.1)
Basophils Relative: 0 %
Eosinophils Absolute: 0 10*3/uL (ref 0.0–0.5)
Eosinophils Relative: 0 %
HCT: 31.9 % — ABNORMAL LOW (ref 39.0–52.0)
Hemoglobin: 10.4 g/dL — ABNORMAL LOW (ref 13.0–17.0)
Immature Granulocytes: 1 %
Lymphocytes Relative: 8 %
Lymphs Abs: 0.8 10*3/uL (ref 0.7–4.0)
MCH: 27.5 pg (ref 26.0–34.0)
MCHC: 32.6 g/dL (ref 30.0–36.0)
MCV: 84.4 fL (ref 80.0–100.0)
Monocytes Absolute: 0.9 10*3/uL (ref 0.1–1.0)
Monocytes Relative: 8 %
Neutro Abs: 8.8 10*3/uL — ABNORMAL HIGH (ref 1.7–7.7)
Neutrophils Relative %: 83 %
Platelets: 276 10*3/uL (ref 150–400)
RBC: 3.78 MIL/uL — ABNORMAL LOW (ref 4.22–5.81)
RDW: 12.2 % (ref 11.5–15.5)
WBC: 10.7 10*3/uL — ABNORMAL HIGH (ref 4.0–10.5)
nRBC: 0 % (ref 0.0–0.2)

## 2019-03-16 LAB — GLUCOSE, CAPILLARY: Glucose-Capillary: 105 mg/dL — ABNORMAL HIGH (ref 70–99)

## 2019-03-16 LAB — TSH: TSH: 1.365 u[IU]/mL (ref 0.350–4.500)

## 2019-03-16 MED ORDER — SODIUM CHLORIDE 0.9 % IV BOLUS
1000.0000 mL | Freq: Once | INTRAVENOUS | Status: AC
  Administered 2019-03-16: 17:00:00 1000 mL via INTRAVENOUS

## 2019-03-16 MED ORDER — INSULIN GLARGINE 100 UNIT/ML ~~LOC~~ SOLN
22.0000 [IU] | Freq: Two times a day (BID) | SUBCUTANEOUS | Status: DC
  Administered 2019-03-16: 23:00:00 22 [IU] via SUBCUTANEOUS
  Filled 2019-03-16 (×4): qty 0.22

## 2019-03-16 MED ORDER — ENOXAPARIN SODIUM 40 MG/0.4ML ~~LOC~~ SOLN
40.0000 mg | SUBCUTANEOUS | Status: DC
  Administered 2019-03-16 – 2019-03-19 (×4): 40 mg via SUBCUTANEOUS
  Filled 2019-03-16 (×4): qty 0.4

## 2019-03-16 MED ORDER — HYDROCODONE-ACETAMINOPHEN 5-325 MG PO TABS
1.0000 | ORAL_TABLET | ORAL | Status: DC | PRN
  Administered 2019-03-20 (×2): 2 via ORAL
  Filled 2019-03-16 (×2): qty 2

## 2019-03-16 MED ORDER — SODIUM CHLORIDE 0.9 % IV SOLN
INTRAVENOUS | Status: DC
  Administered 2019-03-16 – 2019-03-19 (×5): via INTRAVENOUS

## 2019-03-16 MED ORDER — ONDANSETRON HCL 4 MG PO TABS: 4.0000 mg | ORAL_TABLET | Freq: Four times a day (QID) | ORAL | Status: DC | PRN

## 2019-03-16 MED ORDER — VANCOMYCIN HCL IN DEXTROSE 1-5 GM/200ML-% IV SOLN
1000.0000 mg | Freq: Once | INTRAVENOUS | Status: DC
  Filled 2019-03-16: qty 200

## 2019-03-16 MED ORDER — ACETAMINOPHEN 500 MG PO TABS
500.0000 mg | ORAL_TABLET | Freq: Four times a day (QID) | ORAL | Status: DC | PRN
  Administered 2019-03-17: 21:00:00 500 mg via ORAL
  Filled 2019-03-16: qty 1

## 2019-03-16 MED ORDER — SODIUM CHLORIDE 0.9 % IV SOLN
3.0000 g | Freq: Four times a day (QID) | INTRAVENOUS | Status: DC
  Administered 2019-03-17 – 2019-03-19 (×10): 3 g via INTRAVENOUS
  Filled 2019-03-16: qty 8
  Filled 2019-03-16 (×3): qty 3
  Filled 2019-03-16: qty 8
  Filled 2019-03-16: qty 3
  Filled 2019-03-16: qty 8
  Filled 2019-03-16 (×6): qty 3

## 2019-03-16 MED ORDER — INSULIN ASPART 100 UNIT/ML ~~LOC~~ SOLN
0.0000 [IU] | Freq: Every day | SUBCUTANEOUS | Status: DC
  Administered 2019-03-17: 3 [IU] via SUBCUTANEOUS
  Administered 2019-03-18: 21:00:00 2 [IU] via SUBCUTANEOUS
  Administered 2019-03-19: 21:00:00 4 [IU] via SUBCUTANEOUS
  Filled 2019-03-16 (×3): qty 1

## 2019-03-16 MED ORDER — INSULIN ASPART 100 UNIT/ML ~~LOC~~ SOLN
0.0000 [IU] | Freq: Three times a day (TID) | SUBCUTANEOUS | Status: DC
  Administered 2019-03-17: 3 [IU] via SUBCUTANEOUS
  Administered 2019-03-18: 13:00:00 2 [IU] via SUBCUTANEOUS
  Administered 2019-03-18 (×2): 3 [IU] via SUBCUTANEOUS
  Administered 2019-03-19: 09:00:00 2 [IU] via SUBCUTANEOUS
  Administered 2019-03-19: 12:00:00 7 [IU] via SUBCUTANEOUS
  Administered 2019-03-20: 2 [IU] via SUBCUTANEOUS
  Administered 2019-03-20: 1 [IU] via SUBCUTANEOUS
  Filled 2019-03-16 (×8): qty 1

## 2019-03-16 MED ORDER — KETOROLAC TROMETHAMINE 15 MG/ML IJ SOLN
15.0000 mg | Freq: Four times a day (QID) | INTRAMUSCULAR | Status: DC | PRN
  Administered 2019-03-18: 18:00:00 15 mg via INTRAVENOUS
  Filled 2019-03-16 (×2): qty 1

## 2019-03-16 MED ORDER — SODIUM CHLORIDE 0.9 % IV SOLN
2.0000 g | Freq: Once | INTRAVENOUS | Status: AC
  Administered 2019-03-16: 17:00:00 2 g via INTRAVENOUS
  Filled 2019-03-16: qty 20

## 2019-03-16 MED ORDER — ONDANSETRON HCL 4 MG/2ML IJ SOLN: 4.0000 mg | Freq: Four times a day (QID) | INTRAMUSCULAR | Status: DC | PRN

## 2019-03-16 MED ORDER — EMPAGLIFLOZIN 10 MG PO TABS: 10.0000 mg | ORAL_TABLET | Freq: Every day | ORAL | Status: DC

## 2019-03-16 MED ORDER — VANCOMYCIN HCL IN DEXTROSE 1-5 GM/200ML-% IV SOLN
1000.0000 mg | INTRAVENOUS | Status: DC
  Administered 2019-03-16: 1000 mg via INTRAVENOUS
  Filled 2019-03-16: qty 200

## 2019-03-16 MED ORDER — METFORMIN HCL 500 MG PO TABS
1000.0000 mg | ORAL_TABLET | Freq: Two times a day (BID) | ORAL | Status: DC
  Filled 2019-03-16: qty 2

## 2019-03-16 MED ORDER — ENALAPRIL MALEATE 20 MG PO TABS
20.0000 mg | ORAL_TABLET | Freq: Two times a day (BID) | ORAL | Status: DC
  Administered 2019-03-16 – 2019-03-20 (×8): 20 mg via ORAL
  Filled 2019-03-16 (×10): qty 1

## 2019-03-16 NOTE — ED Provider Notes (Signed)
[facility] Emergency Department Provider Note ____________________________________________   First MD Initiated Contact with Patient 03/16/19 1523     (approximate)  I have reviewed the triage vital signs and the nursing notes.   HISTORY  Chief Complaint Recurrent Skin Infections    HPI Adrian Taylor is a 48 y.o. male with PMH as noted below who presents with right foot pain and swelling over the last 5 days, gradual onset, and persistent course despite being on antibiotics.  The patient states that he initially was injured with a piece of metal but ended up in his shoe while at work.  He took the metal out but it left a wound on the bottom of his foot which became infected.  The patient states that he started the antibiotics on Monday but does not know the name of the antibiotic.  He was subsequently started on a second antibiotic a few days ago, and states he had an outpatient x-ray yesterday.  He reports feeling some subjective fever and chills today and states that the pain in the foot goes up about halfway towards his knee.  Past Medical History:  Diagnosis Date  . Diabetes mellitus without complication (HCC)   . Hypertension     Patient Active Problem List   Diagnosis Date Noted  . Cellulitis 03/16/2019  . Cauda equina compression (HCC) 08/03/2017    Past Surgical History:  Procedure Laterality Date  . LUMBAR LAMINECTOMY/DECOMPRESSION MICRODISCECTOMY N/A 08/03/2017   Procedure: LUMBAR LAMINECTOMY/DECOMPRESSION MICRODISCECTOMY L1-4;  Surgeon: Venetia NightYarbrough, Chester, MD;  Location: [facility] ORS;  Service: Neurosurgery;  Laterality: N/A;    Prior to Admission medications   Medication Sig Start Date End Date Taking? Authorizing Provider  acetaminophen (TYLENOL) 500 MG tablet Take 500 mg by mouth every 6 (six) hours as needed.   Yes [provider]  cephALEXin (KEFLEX) 500 MG capsule Take 500 mg by mouth 3 (three) times daily.   Yes  [provider]  empagliflozin (JARDIANCE) 10 MG TABS tablet Take 10 mg by mouth daily.   Yes [provider]  enalapril (VASOTEC) 20 MG tablet Take 20 mg by mouth 2 (two) times daily. 03/13/19  Yes [provider]  insulin glargine (LANTUS) 100 UNIT/ML injection Inject 22 Units into the skin 2 (two) times daily.    Yes [provider]  metFORMIN (GLUCOPHAGE) 500 MG tablet Take 1,000 mg by mouth 2 (two) times daily with a meal.   Yes [provider]  sulfamethoxazole-trimethoprim (BACTRIM DS) 800-160 MG tablet Take 1 tablet by mouth 2 (two) times daily.   Yes [provider]    Allergies Novolog [insulin aspart]  No family history on file.  Social History Social History   Tobacco Use  . Smoking status: Former Smoker    Quit date: 06/07/2002    Years since quitting: 16.7  . Tobacco comment: Last tobacco use 15 years ago  Substance Use Topics  . Alcohol use: Yes    Frequency: Never  . Drug use: Not on file    Review of Systems  Constitutional: Positive for chills. Eyes: No redness. ENT: No sore throat. Cardiovascular: Denies chest pain. Respiratory: Denies shortness of breath. Gastrointestinal: No vomiting. Genitourinary: Negative for dysuria.  Musculoskeletal: Negative for back pain. Skin: Negative for rash. Neurological: Negative for headache.   ____________________________________________   PHYSICAL EXAM:  VITAL SIGNS: ED Triage Vitals  Enc Vitals Group     BP 03/16/19 1329 (!) 154/82     Pulse  Rate 03/16/19 1329 96     Resp 03/16/19 1329 16     Temp 03/16/19 1329 98.4 F (36.9 C)     Temp Source 03/16/19 1329 Oral     SpO2 03/16/19 1329 100 %     Weight 03/16/19 1333 108 lb 0.4 oz (49 kg)     Height 03/16/19 1333 5\' 2"  (1.575 m)     Head Circumference --      Peak Flow --      Pain Score 03/16/19 1333 6     Pain Loc --      Pain Edu? --      Excl. in GC? --     Constitutional: Alert and oriented.   Relatively well appearing and in no acute distress. Eyes: Conjunctivae are normal.  Head: Atraumatic. Nose: No congestion/rhinnorhea. Mouth/Throat: Mucous membranes are moist.   Neck: Normal range of motion.  Cardiovascular: Normal rate, regular rhythm. Good peripheral circulation. Respiratory: Normal respiratory effort.  No retractions.  Gastrointestinal: No distention.  Musculoskeletal: No lower extremity edema.  Extremities warm and well perfused.  Right foot with erythema, induration, and swelling over approximately the distal half of the dorsal surface of the foot.  Approximately 2 cm superficial appearing wound to the distal plantar aspect of the foot with no drainage.  No erythema or streaking going up the leg.  Full range of motion at the ankle and knee. Neurologic:  Normal speech and language. No gross focal neurologic deficits are appreciated.  Skin:  Skin is warm and dry. No rash noted. Psychiatric: Mood and affect are normal. Speech and behavior are normal.  ____________________________________________   LABS (all labs ordered are listed, but only abnormal results are displayed)  Labs Reviewed  CBC WITH DIFFERENTIAL/PLATELET - Abnormal; Notable for the following components:      Result Value   WBC 10.7 (*)    RBC 3.78 (*)    Hemoglobin 10.4 (*)    HCT 31.9 (*)    Neutro Abs 8.8 (*)    All other components within normal limits  COMPREHENSIVE METABOLIC PANEL - Abnormal; Notable for the following components:   Sodium 127 (*)    Chloride 96 (*)    CO2 20 (*)    Glucose, Bld 466 (*)    Calcium 8.1 (*)    Albumin 3.1 (*)    All other components within normal limits  CULTURE, BLOOD (ROUTINE X 2)  CULTURE, BLOOD (ROUTINE X 2)  SARS CORONAVIRUS 2 (TAT 6-24 HRS)  LACTIC ACID, PLASMA  LACTIC ACID, PLASMA  HEMOGLOBIN A1C   ____________________________________________  EKG   ____________________________________________  RADIOLOGY     ____________________________________________   PROCEDURES  Procedure(s) performed: No  Procedures  Critical Care performed: No ____________________________________________   INITIAL IMPRESSION / ASSESSMENT AND PLAN / ED COURSE  Pertinent labs & imaging results that were available during my care of the patient were reviewed by me and considered in my medical decision making (see chart for details).  48 year old male with a history of diabetes and other PMH as noted above presents with right foot pain, erythema, and swelling over the last 5 days after he states a piece of metal that got into his shoe caused a wound.  The patient states he has been on antibiotics for the last 4 days with no improvement.  He reports some subjective fever and chills today.  I reviewed the past medical records in epic.  I do not see any report from his urgent care or  PMD visits this week, however x-ray of the right foot performed yesterday shows no tracking soft tissue gas and no cortical erosion or evidence of osteomyelitis.  On exam the patient is overall relatively well-appearing and his vital signs are normal except for hypertension.  He is afebrile.  He has erythema and swelling to the right foot as above.  The remainder of the exam is as described above.  Overall presentation is consistent with right foot cellulitis.  Based on the diabetes history and the fact that the patient has failed outpatient antibiotics, I anticipate that he will need admission for IV antibiotics.  I am attempting to ascertain from him which antibiotics he has been on as he did not bring them with him and does not know the name.  We will obtain lab work-up, cultures, and admit.  ----------------------------------------- 6:18 PM on 03/16/2019 -----------------------------------------  The patient reports that he was on Keflex and then Bactrim was added a few days ago.  Lab work-up so far is reassuring.  I ordered vancomycin and  ceftriaxone and signed the patient out to the hospitalist Dr. Posey Pronto for admission.  __________________________  [name] was evaluated in Emergency Department on 03/16/2019 for the symptoms described in the history of present illness. He was evaluated in the context of the global COVID-19 pandemic, which necessitated consideration that the patient might be at risk for infection with the SARS-CoV-2 virus that causes COVID-19. Institutional protocols and algorithms that pertain to the evaluation of patients at risk for COVID-19 are in a state of rapid change based on information released by regulatory bodies including the CDC and federal and state organizations. These policies and algorithms were followed during the patient's care in the ED. ____________________________________________   FINAL CLINICAL IMPRESSION(S) / ED DIAGNOSES  Final diagnoses:  Cellulitis of right lower extremity      NEW MEDICATIONS STARTED DURING THIS VISIT:  New Prescriptions   No medications on file     Note:  This document was prepared using Dragon voice recognition software and may include unintentional dictation errors.    Arta Silence, MD 03/16/19 1819

## 2019-03-16 NOTE — Consult Note (Signed)
Pharmacy Antibiotic Note  Adrian Taylor is a 48 y.o. male admitted on 03/16/2019 with cellulitis.  Pharmacy has been consulted for Unasyn and vancomycin dosing.  Plan: Unasyn 3 g q6H   Pt received vancomycin loading dose of 1000 mg in the ED. Will order a maintenance dose of 1000 mg q24H. Predicted AUC is 507. Goal AUC is 400-550. Scr used 1.01. Css min 10.6. Plan to order vancomycin levels in 4-5 days.   Height: 5\' 2"  (157.5 cm) Weight: 108 lb 0.4 oz (49 kg) IBW/kg (Calculated) : 54.6  Temp (24hrs), Avg:98.4 F (36.9 C), Min:98.4 F (36.9 C), Max:98.4 F (36.9 C)  Recent Labs  Lab 03/16/19 1335 03/16/19 1546  WBC 10.7*  --   CREATININE 1.01  --   LATICACIDVEN  --  1.6    Estimated Creatinine Clearance: 62 mL/min (by C-G formula based on SCr of 1.01 mg/dL).    Allergies  Allergen Reactions  . Novolog [Insulin Aspart] Itching    Pt states not allergic     Antimicrobials this admission: 10/9 ceftriaxone x 1 10/9 Unasyn >>  10/9 Vancomycin >>   Dose adjustments this admission: None  Microbiology results: 10/9 BCx: Pending  Thank you for allowing pharmacy to be a part of this patient's care.  Oswald Hillock, PharmD, BCPS 03/16/2019 5:48 PM

## 2019-03-16 NOTE — H&P (Signed)
[place] [facility] [place] NAME: Adrian Taylor    MR#:  [id number]  DATE OF BIRTH:  1970-06-23  DATE OF ADMISSION:  03/16/2019  PRIMARY CARE PHYSICIAN: [facility] [place]   REQUESTING/REFERRING PHYSICIAN:   CHIEF COMPLAINT:   Chief Complaint  Patient presents with  . Recurrent Skin Infections    HISTORY OF PRESENT ILLNESS: Adrian Taylor  is a 48 y.o. male with a known history of Diabetes type 2, essential hypertension who is presenting to the emergency room with complaint of right foot pain and swelling over the past days.  Is has progressively gotten worse.  He was treated with oral antibiotics.  States that this initially started when he took out a metal from his shoe and subsequently noticed these problems.  Patient has had fevers at home.  Denies any cough or chills. X-ray of the foot done yesterday shows no evidence of fluid collection or osteomyelitis.  PAST MEDICAL HISTORY:   Past Medical History:  Diagnosis Date  . Diabetes mellitus without complication ([place])   . Hypertension     PAST SURGICAL HISTORY:  Past Surgical History:  Procedure Laterality Date  . LUMBAR LAMINECTOMY/DECOMPRESSION MICRODISCECTOMY N/A 08/03/2017   Procedure: LUMBAR LAMINECTOMY/DECOMPRESSION MICRODISCECTOMY L1-4;  Surgeon: Meade Maw, MD;  Location: [facility] ORS;  Service: Neurosurgery;  Laterality: N/A;    SOCIAL HISTORY:  Social History   Tobacco Use  . Smoking status: Former Smoker    Quit date: 06/07/2002    Years since quitting: 16.7  . Tobacco comment: Last tobacco use 15 years ago  Substance Use Topics  . Alcohol use: Yes    Frequency: Never    FAMILY HISTORY: No family history on file.  DRUG ALLERGIES:  Allergies  Allergen Reactions  . Novolog [Insulin Aspart] Itching    Pt states not allergic     REVIEW OF SYSTEMS:   CONSTITUTIONAL: No fever, fatigue or weakness.  EYES: No blurred or double vision.   EARS, NOSE, AND THROAT: No tinnitus or ear pain.  RESPIRATORY: No cough, shortness of breath, wheezing or hemoptysis.  CARDIOVASCULAR: No chest pain, orthopnea, edema.  GASTROINTESTINAL: No nausea, vomiting, diarrhea or abdominal pain.  GENITOURINARY: No dysuria, hematuria.  ENDOCRINE: No polyuria, nocturia,  HEMATOLOGY: No anemia, easy bruising or bleeding SKIN: RIGHT FOOT SWELLING and pain MUSCULOSKELETAL: No joint pain or arthritis.   NEUROLOGIC: No tingling, numbness, weakness.  PSYCHIATRY: No anxiety or depression.   MEDICATIONS AT HOME:  Prior to Admission medications   Medication Sig Start Date End Date Taking? Authorizing Provider  acetaminophen (TYLENOL) 500 MG tablet Take 500 mg by mouth every 6 (six) hours as needed.   Yes [provider]  cephALEXin (KEFLEX) 500 MG capsule Take 500 mg by mouth 3 (three) times daily.   Yes [provider]  empagliflozin (JARDIANCE) 10 MG TABS tablet Take 10 mg by mouth daily.   Yes [provider]  enalapril (VASOTEC) 20 MG tablet Take 20 mg by mouth 2 (two) times daily. 03/13/19  Yes [provider]  insulin glargine (LANTUS) 100 UNIT/ML injection Inject 22 Units into the skin 2 (two) times daily.    Yes [provider]  metFORMIN (GLUCOPHAGE) 500 MG tablet Take 1,000 mg by mouth 2 (two) times daily with a meal.   Yes [provider]  sulfamethoxazole-trimethoprim (BACTRIM DS) 800-160 MG tablet Take 1 tablet by mouth 2 (two) times daily.   Yes [provider]  PHYSICAL EXAMINATION:   VITAL SIGNS: Blood pressure (!) 156/101, pulse 98, temperature 98.4 F (36.9 C), temperature source Oral, resp. rate 16, height 5\' 2"  (1.575 m), weight 49 kg, SpO2 100 %.  GENERAL:  48 y.o.-year-old patient lying in the bed with no acute distress.  EYES: Pupils equal, round, reactive to light and accommodation. No scleral icterus. Extraocular muscles intact.  HEENT: Head atraumatic,  normocephalic. Oropharynx and nasopharynx clear.  NECK:  Supple, no jugular venous distention. No thyroid enlargement, no tenderness.  LUNGS: Normal breath sounds bilaterally, no wheezing, rales,rhonchi or crepitation. No use of accessory muscles of respiration.  CARDIOVASCULAR: S1, S2 normal. No murmurs, rubs, or gallops.  ABDOMEN: Soft, nontender, nondistended. Bowel sounds present. No organomegaly or mass.  EXTREMITIES: No pedal edema, cyanosis, or clubbing.  NEUROLOGIC: Cranial nerves II through XII are intact. Muscle strength 5/5 in all extremities. Sensation intact. Gait not checked.  PSYCHIATRIC: The patient is alert and oriented x 3.  SKIN: Right foot is swollen warm to touch there is an area of entry point on the bottom aspect of the foot.   LABORATORY PANEL:   CBC Recent Labs  Lab 03/16/19 1335  WBC 10.7*  HGB 10.4*  HCT 31.9*  PLT 276  MCV 84.4  MCH 27.5  MCHC 32.6  RDW 12.2  LYMPHSABS 0.8  MONOABS 0.9  EOSABS 0.0  BASOSABS 0.0   ------------------------------------------------------------------------------------------------------------------  Chemistries  Recent Labs  Lab 03/16/19 1335  NA 127*  K 4.8  CL 96*  CO2 20*  GLUCOSE 466*  BUN 18  CREATININE 1.01  CALCIUM 8.1*  AST 15  ALT 10  ALKPHOS 60  BILITOT 0.7   ------------------------------------------------------------------------------------------------------------------ estimated creatinine clearance is 62 mL/min (by C-G formula based on SCr of 1.01 mg/dL). ------------------------------------------------------------------------------------------------------------------ No results for input(s): TSH, T4TOTAL, T3FREE, THYROIDAB in the last 72 hours.  Invalid input(s): FREET3   Coagulation profile No results for input(s): INR, PROTIME in the last 168 hours. ------------------------------------------------------------------------------------------------------------------- No results for  input(s): DDIMER in the last 72 hours. -------------------------------------------------------------------------------------------------------------------  Cardiac Enzymes No results for input(s): CKMB, TROPONINI, MYOGLOBIN in the last 168 hours.  Invalid input(s): CK ------------------------------------------------------------------------------------------------------------------ Invalid input(s): POCBNP  ---------------------------------------------------------------------------------------------------------------  Urinalysis No results found for: COLORURINE, APPEARANCEUR, LABSPEC, PHURINE, GLUCOSEU, HGBUR, BILIRUBINUR, KETONESUR, PROTEINUR, UROBILINOGEN, NITRITE, LEUKOCYTESUR   RADIOLOGY: No results found.  EKG: No orders found for this or any previous visit.  IMPRESSION AND PLAN: Patient is a 48 year old Hispanic male presenting with right foot infection  1.  Right foot infection with cellulitis we will treat with IV vancomycin and Unasyn. If no improvement consider MRI of the foot  2.  Diabetes type 2 with elevated blood sugars will place on sliding scale insulin continue his Lantus and his other medications check a hemoglobin A1c  3.  Accelerated hypertension continue therapy with Vasotec I will place IV hydralazine PRN due to elevated blood pressure  4.  Miscellaneous Lovenox for DVT prophylaxis  All the records are reviewed and case discussed with ED provider. Management plans discussed with the patient, family and they are in agreement.  CODE STATUS: Code Status History    Date Active Date Inactive Code Status Order ID Comments User Context   08/03/2017 2312 08/05/2017 0130 Full Code 10/05/2017  [id number], PA-C Inpatient   Advance Care Planning Activity       TOTAL TIME TAKING CARE OF THIS PATIENT: Ivar Drape.    M.D on 03/16/2019 at 5:30 PM  Between 7am to 6pm - Pager -  [phone]  After 6pm go to [email or web address] - Air traffic controllerpassword [email or web address]  [facility]  [employer] Office  [phone]  CC: Primary care physician; [facility]

## 2019-03-16 NOTE — ED Triage Notes (Signed)
Pt arrives with complaints of right foot pain/infection. PT's right foot swollen & red. Initial injury happened last Friday where pt had a piece of metal cut his foot; foot became infected and was given antibiotics. Pt reports he is still on antibiotic regimen.

## 2019-03-16 NOTE — ED Notes (Signed)
MD and this RN at bedside with interpretor

## 2019-03-17 ENCOUNTER — Inpatient Hospital Stay: Payer: BC Managed Care – PPO

## 2019-03-17 LAB — CBC
HCT: 31.4 % — ABNORMAL LOW (ref 39.0–52.0)
Hemoglobin: 10.4 g/dL — ABNORMAL LOW (ref 13.0–17.0)
MCH: 27.7 pg (ref 26.0–34.0)
MCHC: 33.1 g/dL (ref 30.0–36.0)
MCV: 83.5 fL (ref 80.0–100.0)
Platelets: 320 10*3/uL (ref 150–400)
RBC: 3.76 MIL/uL — ABNORMAL LOW (ref 4.22–5.81)
RDW: 11.9 % (ref 11.5–15.5)
WBC: 9.8 10*3/uL (ref 4.0–10.5)
nRBC: 0 % (ref 0.0–0.2)

## 2019-03-17 LAB — BASIC METABOLIC PANEL
Anion gap: 8 (ref 5–15)
BUN: 15 mg/dL (ref 6–20)
CO2: 23 mmol/L (ref 22–32)
Calcium: 7.9 mg/dL — ABNORMAL LOW (ref 8.9–10.3)
Chloride: 104 mmol/L (ref 98–111)
Creatinine, Ser: 0.58 mg/dL — ABNORMAL LOW (ref 0.61–1.24)
GFR calc Af Amer: >60 mL/min (ref >60)
GFR calc non Af Amer: >60 mL/min (ref >60)
Glucose, Bld: 219 mg/dL — ABNORMAL HIGH (ref 70–99)
Potassium: 4 mmol/L (ref 3.5–5.1)
Sodium: 135 mmol/L (ref 135–145)

## 2019-03-17 LAB — SARS CORONAVIRUS 2 (TAT 6-24 HRS): SARS Coronavirus 2: NEGATIVE

## 2019-03-17 LAB — GLUCOSE, CAPILLARY
Glucose-Capillary: 72 mg/dL (ref 70–99)
Glucose-Capillary: 213 mg/dL — ABNORMAL HIGH (ref 70–99)
Glucose-Capillary: 110 mg/dL — ABNORMAL HIGH (ref 70–99)
Glucose-Capillary: 277 mg/dL — ABNORMAL HIGH (ref 70–99)

## 2019-03-17 LAB — HIV ANTIBODY (ROUTINE TESTING W REFLEX): HIV Screen 4th Generation wRfx: NONREACTIVE

## 2019-03-17 MED ORDER — VANCOMYCIN HCL 1.25 G IV SOLR
1250.0000 mg | INTRAVENOUS | Status: DC
  Filled 2019-03-17: qty 1250

## 2019-03-17 MED ORDER — INSULIN GLARGINE 100 UNIT/ML ~~LOC~~ SOLN
10.0000 [IU] | Freq: Every day | SUBCUTANEOUS | Status: DC
  Administered 2019-03-17 – 2019-03-19 (×3): 10 [IU] via SUBCUTANEOUS
  Filled 2019-03-17 (×4): qty 0.1

## 2019-03-17 MED ORDER — VANCOMYCIN HCL 1.5 G IV SOLR
1500.0000 mg | INTRAVENOUS | Status: DC
  Administered 2019-03-17 – 2019-03-18 (×2): 1500 mg via INTRAVENOUS
  Filled 2019-03-17 (×3): qty 1500

## 2019-03-17 MED ORDER — ENSURE PRE-SURGERY PO LIQD
296.0000 mL | Freq: Once | ORAL | Status: DC
  Administered 2019-03-18: 296 mL via ORAL
  Filled 2019-03-17: qty 296

## 2019-03-17 MED ORDER — METOPROLOL TARTRATE 25 MG PO TABS
25.0000 mg | ORAL_TABLET | Freq: Two times a day (BID) | ORAL | Status: DC
  Administered 2019-03-17 – 2019-03-20 (×6): 25 mg via ORAL
  Filled 2019-03-17 (×6): qty 1

## 2019-03-17 NOTE — Consult Note (Signed)
Pharmacy Antibiotic Note  Adrian Taylor is a 48 y.o. male admitted on 03/16/2019 with cellulitis.  Pharmacy has been consulted for Unasyn and vancomycin dosing.  Plan: Unasyn 3 g q6H   Pt received vancomycin loading dose of 1000 mg in the ED. Will adjust maintenance dose of 1000 mg q24H to 1500 mg q24H (improvement in Scr and weight increase). Predicted AUC is 566.8. Goal AUC is 400-550. Scr used 0.8. Css min 10.  Plan to order vancomycin levels in 4-5 days.   Height: 5' (152.4 cm) Weight: 114 lb 10.2 oz (52 kg) IBW/kg (Calculated) : 50  Temp (24hrs), Avg:98.3 F (36.8 C), Min:98.1 F (36.7 C), Max:98.4 F (36.9 C)  Recent Labs  Lab 03/16/19 1335 03/16/19 1546 03/17/19 0419  WBC 10.7*  --  9.8  CREATININE 1.01  --  0.58*  LATICACIDVEN  --  1.6  --     Estimated Creatinine Clearance: 79.9 mL/min (A) (by C-G formula based on SCr of 0.58 mg/dL (L)).    Allergies  Allergen Reactions  . Novolog [Insulin Aspart] Itching    Pt states not allergic     Antimicrobials this admission: 10/9 ceftriaxone x 1 10/9 Unasyn >>  10/9 Vancomycin >>   Dose adjustments this admission: None  Microbiology results: 10/9 BCx: Pending  Thank you for allowing pharmacy to be a part of this patient's care.  Oswald Hillock, PharmD, BCPS 03/17/2019 9:43 AM

## 2019-03-17 NOTE — Consult Note (Signed)
ORTHOPAEDIC CONSULTATION  REQUESTING PHYSICIAN: Delfino Lovett, MD  Chief Complaint: Right foot infection with possible osteomyelitis  HPI: Adrian Taylor is a 48 y.o. male who complains of infection to his right foot.  About 5 days ago stepped on a piece of metal while at work.  Noted worsening infection and redness to the area.  Seen in the ER and admitted with redness and swelling of his foot.  Patient denies any severe pain into his foot at this time.  MRI was performed concerning for osteomyelitis with abscess of the foot.    Past Medical History:  Diagnosis Date  . Diabetes mellitus without complication ([facility])   . Hypertension    Past Surgical History:  Procedure Laterality Date  . LUMBAR LAMINECTOMY/DECOMPRESSION MICRODISCECTOMY N/A 08/03/2017   Procedure: LUMBAR LAMINECTOMY/DECOMPRESSION MICRODISCECTOMY L1-4;  Surgeon: Venetia Night, MD;  Location: [facility] ORS;  Service: Neurosurgery;  Laterality: N/A;   Social History   Socioeconomic History  . Marital status: Married    Spouse name: Not on file  . Number of children: Not on file  . Years of education: Not on file  . Highest education level: Not on file  Occupational History  . Not on file  Social Needs  . Financial resource strain: Not on file  . Food insecurity    Worry: Not on file    Inability: Not on file  . Transportation needs    Medical: Not on file    Non-medical: Not on file  Tobacco Use  . Smoking status: Former Smoker    Quit date: 06/07/2002    Years since quitting: 16.7  . Smokeless tobacco: Never Used  . Tobacco comment: Last tobacco use 15 years ago  Substance and Sexual Activity  . Alcohol use: Yes    Frequency: Never  . Drug use: Not on file  . Sexual activity: Not on file  Lifestyle  . Physical activity    Days per week: Not on file    Minutes per session: Not on file  . Stress: Not on file  Relationships  . Social Musician on phone: Not on file    Gets together:  Not on file    Attends religious service: Not on file    Active member of club or organization: Not on file    Attends meetings of clubs or organizations: Not on file    Relationship status: Not on file  Other Topics Concern  . Not on file  Social History Narrative  . Not on file   History reviewed. No pertinent family history. Allergies  Allergen Reactions  . Novolog [Insulin Aspart] Itching    Pt states not allergic    Prior to Admission medications   Medication Sig Start Date End Date Taking? Authorizing Provider  acetaminophen (TYLENOL) 500 MG tablet Take 500 mg by mouth every 6 (six) hours as needed.   Yes [provider]  cephALEXin (KEFLEX) 500 MG capsule Take 500 mg by mouth 3 (three) times daily.   Yes [provider]  empagliflozin (JARDIANCE) 10 MG TABS tablet Take 10 mg by mouth daily.   Yes [provider]  enalapril (VASOTEC) 20 MG tablet Take 20 mg by mouth 2 (two) times daily. 03/13/19  Yes [provider]  insulin glargine (LANTUS) 100 UNIT/ML injection Inject 22 Units into the skin 2 (two) times daily.    Yes [provider]  metFORMIN (GLUCOPHAGE) 500 MG tablet Take 1,000 mg by mouth 2 (two)  times daily with a meal.   Yes [provider]  sulfamethoxazole-trimethoprim (BACTRIM DS) 800-160 MG tablet Take 1 tablet by mouth 2 (two) times daily.   Yes [provider]   Mr Foot Right Wo Contrast  Result Date: 03/17/2019 CLINICAL DATA:  Worsening right foot pain and swelling over the past several days in a diabetic patient. EXAM: MRI OF THE RIGHT FOREFOOT WITHOUT CONTRAST TECHNIQUE: Multiplanar, multisequence MR imaging of the right forefoot was performed. No intravenous contrast was administered. COMPARISON:  None. FINDINGS: Bones/Joint/Cartilage The patient has a small second MTP joint effusion. There is marrow edema in the head and neck of the second metacarpal and proximal 2 cm of the proximal phalanx of the  second toe. No fracture or stress change. No evidence of arthropathy. Ligaments Intact. Muscles and Tendons Negative for myositis. No intramuscular fluid collection. 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 Soft tissues Skin ulceration is seen on the plantar surface of the foot deep to the second MTP joint. A fluid collection at and medial to the ulceration measures approximately 2.6 cm long by 2.5 cm transverse by 0.5 cm craniocaudal. Subcutaneous edema over the dorsum of the foot is noted. 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 Skin ulceration on the plantar surface of the foot deep to the second MTP joint with an associated abscess in the subcutaneous tissues as described above. Electronically Signed   By: Inge Rise M.D.   On: 03/17/2019 11:30    Positive ROS: All other systems have been reviewed and were otherwise negative with the exception of those mentioned in the HPI and as above.  12 point ROS was performed.  Physical Exam: General: Alert and oriented.  No apparent distress.  Vascular:  Left foot:Dorsalis Pedis:  present Posterior Tibial:  present  Right foot: Dorsalis Pedis:  present Posterior Tibial:  present  Neuro:absent protective sensation though gross sensation seems to be intact.  Derm: The plantar aspect of the right foot there is a noted puncture site with a scant amount of purulent drainage.  The site did probe down to the metatarsal itself.  There is noted erythema to the entire dorsal aspect of the right foot.  Ortho/MS: He has no pain with range of motion of the forefoot though protective sensation is diminished to this area.  He does have a diffuse edema to the right foot.  He has good range of motion of the ankle  joint.  Assessment: Abscess status post puncture wound right foot with possible osteomyelitis Diabetes with neuropathy  Plan: The MRI is concerning for osteomyelitis as there is bony edema with an abscess around the second MTPJ.  Did not make mention of cortical erosion or destruction.  I do not see any obvious erosive changes of the bone on my examination of the MRI today.  I would like to evaluate this with an x-ray to see if there is any obvious erosive changes.  Clinically the foot is mildly edematous and there is an ulcer that probes deep.  I discussed with the patient the need for I&D of this area with possible excision of the metatarsophalangeal joint and second toe.  This will be made intraoperatively.  A deep wound culture was performed by myself today.  If grossly the bone seems to be  intact with no erosive changes fragmentation or no obvious signs of erosion on x-ray will possibly perform I&D and continue with IV antibiotics for now.  Can always consider amputation down the road if this progresses as well.  I discussed these options with the patient with the assistance of a Spanish interpreter.  He understands the plan.  Orders have been put in place for surgery in the morning.    Irean HongFowler, Concepcion Kirkpatrick A, DPM Cell [phone]   03/17/2019 4:26 PM

## 2019-03-17 NOTE — Progress Notes (Signed)
1        [employer] - [place] [facility]   PATIENT NAME: Adrian Taylor    MR#:  [id number]  DATE OF BIRTH:  [birth year]  SUBJECTIVE:  CHIEF COMPLAINT:   Chief Complaint  Patient presents with  . Recurrent Skin Infections  right foot pain and swelling REVIEW OF SYSTEMS:  Review of Systems  Constitutional: Negative for diaphoresis, fever, malaise/fatigue and weight loss.  HENT: Negative for ear discharge, ear pain, hearing loss, nosebleeds, sore throat and tinnitus.   Eyes: Negative for blurred vision and pain.  Respiratory: Negative for cough, hemoptysis, shortness of breath and wheezing.   Cardiovascular: Negative for chest pain, palpitations, orthopnea and leg swelling.  Gastrointestinal: Negative for abdominal pain, blood in stool, constipation, diarrhea, heartburn, nausea and vomiting.  Genitourinary: Negative for dysuria, frequency and urgency.  Musculoskeletal: Negative for back pain and myalgias.  Skin: Positive for rash. Negative for itching.  Neurological: Negative for dizziness, tingling, tremors, focal weakness, seizures, weakness and headaches.  Psychiatric/Behavioral: Negative for depression. The patient is not nervous/anxious.     DRUG ALLERGIES:   Allergies  Allergen Reactions  . Novolog [Insulin Aspart] Itching    Pt states not allergic    VITALS:  Blood pressure 128/75, pulse 93, temperature 98.3 F (36.8 C), temperature source Oral, resp. rate 16, height 5' (1.524 m), weight 52 kg, SpO2 100 %. PHYSICAL EXAMINATION:  Physical Exam HENT:     Head: Normocephalic and atraumatic.  Eyes:     Conjunctiva/sclera: Conjunctivae normal.     Pupils: Pupils are equal, round, and reactive to light.  Neck:     Musculoskeletal: Normal range of motion and neck supple.     Thyroid: No thyromegaly.     Trachea: No tracheal deviation.  Cardiovascular:     Rate and Rhythm: Normal rate and regular rhythm.     Heart sounds: Normal heart  sounds.  Pulmonary:     Effort: Pulmonary effort is normal. No respiratory distress.     Breath sounds: Normal breath sounds. No wheezing.  Chest:     Chest wall: No tenderness.  Abdominal:     General: Bowel sounds are normal. There is no distension.     Palpations: Abdomen is soft.     Tenderness: There is no abdominal tenderness.  Musculoskeletal: Normal range of motion.  Feet:     Comments: Right foot is swollen warm to touch there is an area of entry point on the bottom aspect of the foot Skin:    General: Skin is warm and dry.     Findings: No rash.  Neurological:     Mental Status: He is alert and oriented to person, place, and time.     Cranial Nerves: No cranial nerve deficit.    LABORATORY PANEL:  Male CBC Recent Labs  Lab 03/17/19 0419  WBC 9.8  HGB 10.4*  HCT 31.4*  PLT 320   ------------------------------------------------------------------------------------------------------------------ Chemistries  Recent Labs  Lab 03/16/19 1335 03/17/19 0419  NA 127* 135  K 4.8 4.0  CL 96* 104  CO2 20* 23  GLUCOSE 466* 219*  BUN 18 15  CREATININE 1.01 0.58*  CALCIUM 8.1* 7.9*  AST 15  --   ALT 10  --   ALKPHOS 60  --   BILITOT 0.7  --    RADIOLOGY:  No results found. ASSESSMENT AND PLAN:  Patient is a 48 year old Hispanic male presenting with right foot infection  1.  Right foot infection  with cellulitis  -Continue IV vancomycin and Unasyn. -We will obtain MRI of the foot to rule out osteomyelitis  2.  Diabetes type 2  -His blood sugars has been running low -We will stop metformin and other oral hypoglycemic -Cut back Lantus dose to 10 units -Consult diabetic nurse  3.  Accelerated hypertension -resolved now -Pressure well controlled at this time, continue enalapril 20 mg twice a day  4.   Hyponatremia: Resolved with IV hydration   Miscellaneous Lovenox for DVT prophylaxis     All the records are reviewed and case discussed with Care  Management/Social Worker. Management plans discussed with the patient, nursing and they are in agreement.  CODE STATUS: Full Code  TOTAL TIME TAKING CARE OF THIS PATIENT: 35 minutes.   More than 50% of the time was spent in counseling/coordination of care: YES  POSSIBLE D/C IN 1-2 DAYS, DEPENDING ON CLINICAL CONDITION.   Max Sane M.D on 03/17/2019 at 10:05 AM  Between 7am to 6pm - Pager - [phone]  After 6pm go to [email or web address] - Technical brewer [facility]  Office  [phone]  CC: Primary care physician; [facility], [place]  Note: This dictation was prepared with Diplomatic Services operational officer dictation along with smaller phrase technology. Any transcriptional errors that result from this process are unintentional.

## 2019-03-18 ENCOUNTER — Inpatient Hospital Stay: Payer: BC Managed Care – PPO | Admitting: Anesthesiology

## 2019-03-18 ENCOUNTER — Encounter
Admission: EM | Disposition: A | Discharge status: Home Health | Payer: Self-pay | Source: Home / Self Care | Attending: Internal Medicine

## 2019-03-18 HISTORY — PX: INCISION AND DRAINAGE: SHX5863

## 2019-03-18 LAB — GLUCOSE, CAPILLARY
Glucose-Capillary: 207 mg/dL — ABNORMAL HIGH (ref 70–99)
Glucose-Capillary: 136 mg/dL — ABNORMAL HIGH (ref 70–99)
Glucose-Capillary: 171 mg/dL — ABNORMAL HIGH (ref 70–99)
Glucose-Capillary: 204 mg/dL — ABNORMAL HIGH (ref 70–99)
Glucose-Capillary: 226 mg/dL — ABNORMAL HIGH (ref 70–99)

## 2019-03-18 LAB — BASIC METABOLIC PANEL
Anion gap: 8 (ref 5–15)
BUN: 11 mg/dL (ref 6–20)
CO2: 23 mmol/L (ref 22–32)
Calcium: 8.1 mg/dL — ABNORMAL LOW (ref 8.9–10.3)
Chloride: 104 mmol/L (ref 98–111)
Creatinine, Ser: 0.6 mg/dL — ABNORMAL LOW (ref 0.61–1.24)
GFR calc Af Amer: >60 mL/min (ref >60)
GFR calc non Af Amer: >60 mL/min (ref >60)
Glucose, Bld: 203 mg/dL — ABNORMAL HIGH (ref 70–99)
Potassium: 3.9 mmol/L (ref 3.5–5.1)
Sodium: 135 mmol/L (ref 135–145)

## 2019-03-18 LAB — CBC
HCT: 32.5 % — ABNORMAL LOW (ref 39.0–52.0)
Hemoglobin: 10.6 g/dL — ABNORMAL LOW (ref 13.0–17.0)
MCH: 27.5 pg (ref 26.0–34.0)
MCHC: 32.6 g/dL (ref 30.0–36.0)
MCV: 84.2 fL (ref 80.0–100.0)
Platelets: 355 10*3/uL (ref 150–400)
RBC: 3.86 MIL/uL — ABNORMAL LOW (ref 4.22–5.81)
RDW: 11.9 % (ref 11.5–15.5)
WBC: 8.3 10*3/uL (ref 4.0–10.5)
nRBC: 0 % (ref 0.0–0.2)

## 2019-03-18 LAB — MRSA PCR SCREENING: MRSA by PCR: NEGATIVE

## 2019-03-18 SURGERY — INCISION AND DRAINAGE
Anesthesia: General | Laterality: Right

## 2019-03-18 MED ORDER — BUPIVACAINE HCL (PF) 0.25 % IJ SOLN
INTRAMUSCULAR | Status: AC
  Filled 2019-03-18: qty 30

## 2019-03-18 MED ORDER — LIDOCAINE-EPINEPHRINE 1 %-1:100000 IJ SOLN
INTRAMUSCULAR | Status: DC | PRN
  Administered 2019-03-18: 4 mL

## 2019-03-18 MED ORDER — BUPIVACAINE HCL 0.25 % IJ SOLN
INTRAMUSCULAR | Status: DC | PRN
  Administered 2019-03-18: 4 mL

## 2019-03-18 MED ORDER — MIDAZOLAM HCL 2 MG/2ML IJ SOLN
INTRAMUSCULAR | Status: DC | PRN
  Administered 2019-03-18: 2 mg via INTRAVENOUS

## 2019-03-18 MED ORDER — PROPOFOL 10 MG/ML IV BOLUS
INTRAVENOUS | Status: DC | PRN
  Administered 2019-03-18: 40 mg via INTRAVENOUS

## 2019-03-18 MED ORDER — ENSURE ENLIVE PO LIQD: 237.0000 mL | Freq: Once | ORAL | Status: DC

## 2019-03-18 MED ORDER — PROPOFOL 500 MG/50ML IV EMUL
INTRAVENOUS | Status: AC
  Filled 2019-03-18: qty 50

## 2019-03-18 MED ORDER — CHLORHEXIDINE GLUCONATE 4 % EX LIQD: 60.0000 mL | Freq: Once | CUTANEOUS | Status: DC

## 2019-03-18 MED ORDER — PROPOFOL 500 MG/50ML IV EMUL
INTRAVENOUS | Status: DC | PRN
  Administered 2019-03-18: 50 ug/kg/min via INTRAVENOUS

## 2019-03-18 MED ORDER — OXYCODONE HCL 5 MG/5ML PO SOLN: 5.0000 mg | Freq: Once | ORAL | Status: DC | PRN

## 2019-03-18 MED ORDER — MIDAZOLAM HCL 2 MG/2ML IJ SOLN
INTRAMUSCULAR | Status: AC
  Filled 2019-03-18: qty 2

## 2019-03-18 MED ORDER — FENTANYL CITRATE (PF) 250 MCG/5ML IJ SOLN
INTRAMUSCULAR | Status: AC
  Filled 2019-03-18: qty 5

## 2019-03-18 MED ORDER — LIDOCAINE-EPINEPHRINE (PF) 1 %-1:200000 IJ SOLN
INTRAMUSCULAR | Status: AC
  Filled 2019-03-18: qty 30

## 2019-03-18 MED ORDER — FENTANYL CITRATE (PF) 100 MCG/2ML IJ SOLN: 25.0000 ug | INTRAMUSCULAR | Status: DC | PRN

## 2019-03-18 MED ORDER — ONDANSETRON HCL 4 MG/2ML IJ SOLN
INTRAMUSCULAR | Status: DC | PRN
  Administered 2019-03-18: 4 mg via INTRAVENOUS

## 2019-03-18 MED ORDER — FENTANYL CITRATE (PF) 100 MCG/2ML IJ SOLN
INTRAMUSCULAR | Status: DC | PRN
  Administered 2019-03-18 (×4): 25 ug via INTRAVENOUS

## 2019-03-18 MED ORDER — POVIDONE-IODINE 10 % EX SWAB: 2.0000 "application " | Freq: Once | CUTANEOUS | Status: DC

## 2019-03-18 MED ORDER — BUPIVACAINE HCL (PF) 0.5 % IJ SOLN
INTRAMUSCULAR | Status: AC
  Filled 2019-03-18: qty 30

## 2019-03-18 MED ORDER — OXYCODONE HCL 5 MG PO TABS: 5.0000 mg | ORAL_TABLET | Freq: Once | ORAL | Status: DC | PRN

## 2019-03-18 MED ORDER — SENNOSIDES-DOCUSATE SODIUM 8.6-50 MG PO TABS
2.0000 | ORAL_TABLET | Freq: Two times a day (BID) | ORAL | Status: DC
  Administered 2019-03-18 – 2019-03-20 (×4): 2 via ORAL
  Filled 2019-03-18 (×4): qty 2

## 2019-03-18 MED ORDER — ONDANSETRON HCL 4 MG/2ML IJ SOLN
INTRAMUSCULAR | Status: AC
  Filled 2019-03-18: qty 2

## 2019-03-18 SURGICAL SUPPLY — 65 items
BLADE OSC/SAGITTAL MD 5.5X18 (BLADE) IMPLANT
BLADE OSCILLATING/SAGITTAL (BLADE)
BLADE SURG 15 STRL LF DISP TIS (BLADE) ×1 IMPLANT
BLADE SURG 15 STRL SS (BLADE) ×1
BLADE SW THK.38XMED LNG THN (BLADE) IMPLANT
BNDG COHESIVE 4X5 TAN STRL (GAUZE/BANDAGES/DRESSINGS) IMPLANT
BNDG COHESIVE 6X5 TAN STRL LF (GAUZE/BANDAGES/DRESSINGS) ×2 IMPLANT
BNDG CONFORM 2 STRL LF (GAUZE/BANDAGES/DRESSINGS) ×2 IMPLANT
BNDG CONFORM 3 STRL LF (GAUZE/BANDAGES/DRESSINGS) ×2 IMPLANT
BNDG ELASTIC 4X5.8 VLCR STR LF (GAUZE/BANDAGES/DRESSINGS) ×2 IMPLANT
BNDG ESMARK 4X12 TAN STRL LF (GAUZE/BANDAGES/DRESSINGS) ×2 IMPLANT
BNDG GAUZE 4.5X4.1 6PLY STRL (MISCELLANEOUS) ×2 IMPLANT
CANISTER SUCT 1200ML W/VALVE (MISCELLANEOUS) ×2 IMPLANT
CANISTER SUCT 3000ML PPV (MISCELLANEOUS) ×2 IMPLANT
CANISTER WOUND CARE 500ML ATS (WOUND CARE) IMPLANT
COVER WAND RF STERILE (DRAPES) ×2 IMPLANT
CUFF TOURN SGL QUICK 12 (TOURNIQUET CUFF) ×2 IMPLANT
CUFF TOURN SGL QUICK 18X4 (TOURNIQUET CUFF) IMPLANT
DRAPE FLUOR MINI C-ARM 54X84 (DRAPES) IMPLANT
DRAPE XRAY CASSETTE 23X24 (DRAPES) IMPLANT
DRESSING ALLEVYN 4X4 (MISCELLANEOUS) IMPLANT
DURAPREP 26ML APPLICATOR (WOUND CARE) ×2 IMPLANT
ELECT REM PT RETURN 9FT ADLT (ELECTROSURGICAL) ×2
ELECTRODE REM PT RTRN 9FT ADLT (ELECTROSURGICAL) ×1 IMPLANT
GAUZE IODOFORM PACK 1/2 7832 (GAUZE/BANDAGES/DRESSINGS) ×2 IMPLANT
GAUZE PACKING 1/4 X5 YD (GAUZE/BANDAGES/DRESSINGS) ×2 IMPLANT
GAUZE PACKING IODOFORM 1X5 (MISCELLANEOUS) ×2 IMPLANT
GAUZE SPONGE 4X4 12PLY STRL (GAUZE/BANDAGES/DRESSINGS) ×2 IMPLANT
GAUZE XEROFORM 1X8 LF (GAUZE/BANDAGES/DRESSINGS) ×2 IMPLANT
GLOVE BIO SURGEON STRL SZ7.5 (GLOVE) ×8 IMPLANT
GLOVE INDICATOR 8.0 STRL GRN (GLOVE) ×2 IMPLANT
GOWN STRL REUS W/ TWL LRG LVL3 (GOWN DISPOSABLE) ×2 IMPLANT
GOWN STRL REUS W/TWL LRG LVL3 (GOWN DISPOSABLE) ×2
GOWN STRL REUS W/TWL MED LVL3 (GOWN DISPOSABLE) ×2 IMPLANT
HANDPIECE VERSAJET DEBRIDEMENT (MISCELLANEOUS) IMPLANT
IV NS 1000ML (IV SOLUTION) ×1
IV NS 1000ML BAXH (IV SOLUTION) ×1 IMPLANT
KIT DRSG VAC SLVR GRANUFM (MISCELLANEOUS) IMPLANT
KIT TURNOVER KIT A (KITS) ×2 IMPLANT
LABEL OR SOLS (LABEL) ×2 IMPLANT
NEEDLE FILTER BLUNT 18X 1/2SAF (NEEDLE) ×1
NEEDLE FILTER BLUNT 18X1 1/2 (NEEDLE) ×1 IMPLANT
NEEDLE HYPO 25X1 1.5 SAFETY (NEEDLE) ×2 IMPLANT
NS IRRIG 500ML POUR BTL (IV SOLUTION) ×2 IMPLANT
PACK EXTREMITY ARMC (MISCELLANEOUS) ×2 IMPLANT
PAD ABD DERMACEA PRESS 5X9 (GAUZE/BANDAGES/DRESSINGS) ×2 IMPLANT
PENCIL ELECTRO HAND CTR (MISCELLANEOUS) ×2 IMPLANT
PULSAVAC PLUS IRRIG FAN TIP (DISPOSABLE) ×2
RASP SM TEAR CROSS CUT (RASP) IMPLANT
SHIELD FULL FACE ANTIFOG 7M (MISCELLANEOUS) IMPLANT
SOL .9 NS 3000ML IRR  AL (IV SOLUTION) ×1
SOL .9 NS 3000ML IRR UROMATIC (IV SOLUTION) ×1 IMPLANT
STOCKINETTE IMPERVIOUS 9X36 MD (GAUZE/BANDAGES/DRESSINGS) ×2 IMPLANT
SUT ETHILON 2 0 FS 18 (SUTURE) ×4 IMPLANT
SUT ETHILON 4-0 (SUTURE) ×1
SUT ETHILON 4-0 FS2 18XMFL BLK (SUTURE) ×1
SUT VIC AB 3-0 SH 27 (SUTURE) ×1
SUT VIC AB 3-0 SH 27X BRD (SUTURE) ×1 IMPLANT
SUT VIC AB 4-0 FS2 27 (SUTURE) ×2 IMPLANT
SUTURE ETHLN 4-0 FS2 18XMF BLK (SUTURE) ×1 IMPLANT
SWAB CULTURE AMIES ANAERIB BLU (MISCELLANEOUS) ×2 IMPLANT
SYR 10ML LL (SYRINGE) ×2 IMPLANT
SYR 3ML LL SCALE MARK (SYRINGE) ×2 IMPLANT
TIP FAN IRRIG PULSAVAC PLUS (DISPOSABLE) ×1 IMPLANT
TRAY PREP VAG/GEN (MISCELLANEOUS) ×2 IMPLANT

## 2019-03-18 NOTE — Transfer of Care (Signed)
Immediate Anesthesia Transfer of Care Note  Patient: [name]  Procedure(s) Performed: INCISION AND DRAINAGE (Right )  Patient Location: PACU  Anesthesia Type:General  Level of Consciousness: sedated  Airway & Oxygen Therapy: Patient Spontanous Breathing and Patient connected to nasal cannula oxygen  Post-op Assessment: Report given to RN and Post -op Vital signs reviewed and stable  Post vital signs: Reviewed and stable  Last Vitals:  Vitals Value Taken Time  BP 98/65 03/18/19 0849  Temp    Pulse 76 03/18/19 0849  Resp 10 03/18/19 0849  SpO2 100 % 03/18/19 0849  Vitals shown include unvalidated device data.  Last Pain:  Vitals:   03/18/19 0734  TempSrc:   PainSc: 0-No pain         Complications: No apparent anesthesia complications

## 2019-03-18 NOTE — Anesthesia Postprocedure Evaluation (Signed)
Anesthesia Post Note  Patient: [name]  Procedure(s) Performed: INCISION AND DRAINAGE (Right )  Patient location during evaluation: PACU Anesthesia Type: General Level of consciousness: awake and alert Pain management: pain level controlled Vital Signs Assessment: post-procedure vital signs reviewed and stable Respiratory status: spontaneous breathing, nonlabored ventilation, respiratory function stable and patient connected to nasal cannula oxygen Cardiovascular status: blood pressure returned to baseline and stable Postop Assessment: no apparent nausea or vomiting Anesthetic complications: no     Last Vitals:  Vitals:   03/18/19 0919 03/18/19 0934  BP: 121/79 (!) 142/87  Pulse: 78 80  Resp: 13 13  Temp: 36.7 C   SpO2: 100% 99%    Last Pain:  Vitals:   03/18/19 0934  TempSrc:   PainSc: 0-No pain                 Precious Haws Piscitello

## 2019-03-18 NOTE — Anesthesia Post-op Follow-up Note (Signed)
Anesthesia QCDR form completed.        

## 2019-03-18 NOTE — Anesthesia Preprocedure Evaluation (Addendum)
Anesthesia Evaluation  Patient identified by MRN, date of birth, ID band Patient awake    Reviewed: Allergy & Precautions, H&P , NPO status , Patient's Chart, lab work & pertinent test results  History of Anesthesia Complications Negative for: history of anesthetic complications  Airway Mallampati: III  TM Distance: >3 FB Neck ROM: full    Dental  (+) Chipped, Poor Dentition, Missing   Pulmonary neg pulmonary ROS, neg shortness of breath, former smoker,           Cardiovascular Exercise Tolerance: Good hypertension, (-) angina(-) Past MI and (-) DOE      Neuro/Psych  Neuromuscular disease negative psych ROS   GI/Hepatic negative GI ROS, Neg liver ROS,   Endo/Other  diabetes, Type 2, Insulin Dependent  Renal/GU CRFRenal disease  negative genitourinary   Musculoskeletal   Abdominal   Peds  Hematology negative hematology ROS (+)   Anesthesia Other Findings Past Medical History: No date: Diabetes mellitus without complication (HCC) No date: Hypertension  Past Surgical History: 08/03/2017: LUMBAR LAMINECTOMY/DECOMPRESSION MICRODISCECTOMY; N/A     Comment:  Procedure: LUMBAR LAMINECTOMY/DECOMPRESSION               MICRODISCECTOMY L1-4;  Surgeon: Meade Maw, MD;                Location: [facility] ORS;  Service: Neurosurgery;  Laterality:               N/A;  BMI    Body Mass Index: 22.39 kg/m      Reproductive/Obstetrics negative OB ROS                           Anesthesia Physical Anesthesia Plan  ASA: III  Anesthesia Plan: General   Post-op Pain Management:    Induction: Intravenous  PONV Risk Score and Plan: Propofol infusion and TIVA  Airway Management Planned: Natural Airway and Nasal Cannula  Additional Equipment:   Intra-op Plan:   Post-operative Plan:   Informed Consent: I have reviewed the patients History and Physical, chart, labs and discussed the procedure  including the risks, benefits and alternatives for the proposed anesthesia with the patient or authorized representative who has indicated his/her understanding and acceptance.     Dental Advisory Given  Plan Discussed with: Anesthesiologist, CRNA and Surgeon  Anesthesia Plan Comments: (Consent and history via interpreter   Patient consented for risks of anesthesia including but not limited to:  - adverse reactions to medications - risk of intubation if required - damage to teeth, lips or other oral mucosa - sore throat or hoarseness - Damage to heart, brain, lungs or loss of life  Patient voiced understanding.)       Anesthesia Quick Evaluation

## 2019-03-18 NOTE — Progress Notes (Signed)
[place] [facility] [place] NAME: Adrian Taylor    MR#:  [id number]  DATE OF BIRTH:  1971-05-31  SUBJECTIVE:  CHIEF COMPLAINT:   Chief Complaint  Patient presents with   Recurrent Skin Infections  right foot pain and swelling -waiting for I&D in the OR today REVIEW OF SYSTEMS:  Review of Systems  Constitutional: Negative for diaphoresis, fever, malaise/fatigue and weight loss.  HENT: Negative for ear discharge, ear pain, hearing loss, nosebleeds, sore throat and tinnitus.   Eyes: Negative for blurred vision and pain.  Respiratory: Negative for cough, hemoptysis, shortness of breath and wheezing.   Cardiovascular: Negative for chest pain, palpitations, orthopnea and leg swelling.  Gastrointestinal: Negative for abdominal pain, blood in stool, constipation, diarrhea, heartburn, nausea and vomiting.  Genitourinary: Negative for dysuria, frequency and urgency.  Musculoskeletal: Negative for back pain and myalgias.  Skin: Positive for rash. Negative for itching.  Neurological: Negative for dizziness, tingling, tremors, focal weakness, seizures, weakness and headaches.  Psychiatric/Behavioral: Negative for depression. The patient is not nervous/anxious.     DRUG ALLERGIES:   Allergies  Allergen Reactions   Novolog [Insulin Aspart] Itching    Pt states not allergic    VITALS:  Blood pressure (!) 161/89, pulse 89, temperature 98.7 F (37.1 C), resp. rate 14, height 5' (1.524 m), weight 52 kg, SpO2 100 %. PHYSICAL EXAMINATION:  Physical Exam HENT:     Head: Normocephalic and atraumatic.  Eyes:     Conjunctiva/sclera: Conjunctivae normal.     Pupils: Pupils are equal, round, and reactive to light.  Neck:     Musculoskeletal: Normal range of motion and neck supple.     Thyroid: No thyromegaly.     Trachea: No tracheal deviation.  Cardiovascular:     Rate and Rhythm: Normal rate and regular rhythm.     Heart sounds:  Normal heart sounds.  Pulmonary:     Effort: Pulmonary effort is normal. No respiratory distress.     Breath sounds: Normal breath sounds. No wheezing.  Chest:     Chest wall: No tenderness.  Abdominal:     General: Bowel sounds are normal. There is no distension.     Palpations: Abdomen is soft.     Tenderness: There is no abdominal tenderness.  Musculoskeletal: Normal range of motion.  Feet:     Comments: Right foot is swollen warm to touch there is an area of entry point on the bottom aspect of the foot Skin:    General: Skin is warm and dry.     Findings: No rash.  Neurological:     Mental Status: He is alert and oriented to person, place, and time.     Cranial Nerves: No cranial nerve deficit.    LABORATORY PANEL:  Male CBC Recent Labs  Lab 03/18/19 0452  WBC 8.3  HGB 10.6*  HCT 32.5*  PLT 355   ------------------------------------------------------------------------------------------------------------------ Chemistries  Recent Labs  Lab 03/16/19 1335  03/18/19 0452  NA 127*   < > 135  K 4.8   < > 3.9  CL 96*   < > 104  CO2 20*   < > 23  GLUCOSE 466*   < > 203*  BUN 18   < > 11  CREATININE 1.01   < > 0.60*  CALCIUM 8.1*   < > 8.1*  AST 15  --   --   ALT 10  --   --  ALKPHOS 60  --   --   BILITOT 0.7  --   --    < > = values in this interval not displayed.   RADIOLOGY:  Mr Foot Right Wo Contrast  Result Date: 03/17/2019 CLINICAL DATA:  Worsening right foot pain and swelling over the past several days in a diabetic patient. EXAM: MRI OF THE RIGHT FOREFOOT WITHOUT CONTRAST TECHNIQUE: Multiplanar, multisequence MR imaging of the right forefoot was performed. No intravenous contrast was administered. COMPARISON:  None. FINDINGS: Bones/Joint/Cartilage The patient has a small second MTP joint effusion. There is marrow edema in the head and neck of the second metacarpal and proximal 2 cm of the proximal phalanx of the second toe. No fracture or stress change. No  evidence of arthropathy. Ligaments Intact. Muscles and Tendons Negative for myositis. No intramuscular fluid collection. 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 Soft tissues Skin ulceration is seen on the plantar surface of the foot deep to the second MTP joint. A fluid collection at and medial to the ulceration measures approximately 2.6 cm long by 2.5 cm transverse by 0.5 cm craniocaudal. Subcutaneous edema over the dorsum of the foot is noted. 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 Skin ulceration on the plantar surface of the foot deep to the second MTP joint with an associated abscess in the subcutaneous tissues as described above. Electronically Signed   By: Drusilla Kanner M.D.   On: 03/17/2019 11:30   Dg Foot Complete Right  Result Date: 03/17/2019 CLINICAL DATA:  Pt states right foot pain for 2 weeks. No known injury. Pt states surgery on right foot EXAM: RIGHT FOOT COMPLETE - 3+ VIEW COMPARISON:  MRI right foot 03/17/2019 FINDINGS: There is no evidence of fracture or dislocation. No significant degenerative changes. No focal bony abnormality. Extensive vascular calcification throughout the ankle and foot. IMPRESSION: 1. No bony abnormality identified in the right foot. The findings of septic arthritis seen on the same day MR are not apparent. 2.  Extensive vascular calcification. Electronically Signed   By: Emmaline Kluver M.D.   On: 03/17/2019 17:19   ASSESSMENT AND PLAN:  Patient is a 48 year old Hispanic male presenting with right foot infection  1.  Right foot infection with cellulitis/osteomyelitis -Continue IV vancomycin and Unasyn. -MRI of the foot confirms osteomyelitis -Podiatry planning Incision and drainage right foot plantar second MTPJ  today in the OR  2.  Diabetes type 2  -His blood sugars has been running low -We will stop metformin and other oral hypoglycemic -Cut back Lantus dose to 10 units -Consult diabetic nurse  3.  Accelerated hypertension -resolved now -Pressure well controlled at this time, continue enalapril 20 mg twice a day  4.   Hyponatremia: Resolved with IV hydration   Miscellaneous Lovenox for DVT prophylaxis     All the records are reviewed and case discussed with Care Management/Social Worker. Management plans discussed with the patient, nursing and they are in agreement.  CODE STATUS: Full Code  TOTAL TIME TAKING CARE OF THIS PATIENT: 35 minutes.   More than 50% of the time was spent in counseling/coordination of care: YES  POSSIBLE D/C IN 1-2 DAYS, DEPENDING ON CLINICAL CONDITION.  And podiatry evaluation   Delfino Lovett M.D on 03/18/2019 at 11:00 AM  Between 7am to  6pm - Pager - [phone]  After 6pm go to [email or web address] - Scientist, research (life sciences)password [email or web address] [email or web address]  [employer] [facility]  Office  [phone]  CC: Primary care physician; Center, [facility]  Note: This dictation was prepared with Nurse, children'sDragon dictation along with smaller phrase technology. Any transcriptional errors that result from this process are unintentional.

## 2019-03-18 NOTE — Op Note (Signed)
Operative note   Surgeon:Gillis Boardley Lawyer: None    Preop diagnosis: Puncture abscess plantar right forefoot    Postop diagnosis: Same    Procedure: Incision and drainage right foot plantar second MTPJ    EBL: Minimal    Anesthesia:local and IV sedation.  Local consisted of a one-to-one mixture of 0.25% bupivacaine and 1% lidocaine with epinephrine.  A total 8 cc was used    Hemostasis: Epinephrine infiltrated along the incision site    Specimen: Deep wound culture right foot    Complications: None    Operative indications:Adrian Taylor is an 48 y.o. that presents today for surgical intervention.  The risks/benefits/alternatives/complications have been discussed and consent has been given.    Procedure:  Patient was brought into the OR and placed on the operating table in thesupine position. After anesthesia was obtained theright lower extremity was prepped and draped in usual sterile fashion.  Attention was directed to the plantar aspect of the right foot where the puncture site was noted just deep to the second MTPJ.  A curvilinear incision was made beginning just along the first webspace and ending just proximal to the second MTPJ plantarly.  Sharp and blunt dissection carried down into the subcutaneous tissue.  At this point purulent drainage was noted.  A deep wound culture was performed by myself.  Further dissection was carried down to the capsule.  The wound was flushed with copious amounts of irrigation at this point.  Next the tendon sheath of the long and short flexor was noted at this time.  This was retracted medial and lateral.  The entire plantar capsule of the second TP J was evaluated.  The base of the proximal phalanx to the metatarsal surgical neck was evaluated.  There was not noted to be any destructive changes of the joint capsule.  A small puncture was placed into this and no purulent drainage was noted.  At this point I elected not to completely  open the capsule and expose the cartilage and bone.  The wound was flushed with a pulse lavage with thousand mL's of saline.  The skin was then closed proximal and distal with a 3-0 nylon.  The central aspect was packed with iodoform packing.  A bulky sterile dressing was then applied to the right foot.    Patient tolerated the procedure and anesthesia well.  Was transported from the OR to the PACU with all vital signs stable and vascular status intact.  Podiatry will follow-up for dressing change tomorrow.

## 2019-03-19 ENCOUNTER — Encounter: Payer: Self-pay | Admitting: Podiatry

## 2019-03-19 DIAGNOSIS — W228XXA Striking against or struck by other objects, initial encounter: Secondary | ICD-10-CM

## 2019-03-19 DIAGNOSIS — M869 Osteomyelitis, unspecified: Secondary | ICD-10-CM

## 2019-03-19 DIAGNOSIS — Z79899 Other long term (current) drug therapy: Secondary | ICD-10-CM

## 2019-03-19 DIAGNOSIS — I10 Essential (primary) hypertension: Secondary | ICD-10-CM

## 2019-03-19 DIAGNOSIS — Z888 Allergy status to other drugs, medicaments and biological substances status: Secondary | ICD-10-CM

## 2019-03-19 DIAGNOSIS — Z794 Long term (current) use of insulin: Secondary | ICD-10-CM

## 2019-03-19 DIAGNOSIS — G834 Cauda equina syndrome: Secondary | ICD-10-CM

## 2019-03-19 DIAGNOSIS — S91341A Puncture wound with foreign body, right foot, initial encounter: Secondary | ICD-10-CM

## 2019-03-19 DIAGNOSIS — X58XXXS Exposure to other specified factors, sequela: Secondary | ICD-10-CM

## 2019-03-19 DIAGNOSIS — E1169 Type 2 diabetes mellitus with other specified complication: Principal | ICD-10-CM

## 2019-03-19 DIAGNOSIS — Z87891 Personal history of nicotine dependence: Secondary | ICD-10-CM

## 2019-03-19 DIAGNOSIS — L089 Local infection of the skin and subcutaneous tissue, unspecified: Secondary | ICD-10-CM

## 2019-03-19 DIAGNOSIS — T1490XS Injury, unspecified, sequela: Secondary | ICD-10-CM

## 2019-03-19 DIAGNOSIS — Y990 Civilian activity done for income or pay: Secondary | ICD-10-CM

## 2019-03-19 DIAGNOSIS — E11628 Type 2 diabetes mellitus with other skin complications: Secondary | ICD-10-CM

## 2019-03-19 LAB — CBC
HCT: 28.9 % — ABNORMAL LOW (ref 39.0–52.0)
Hemoglobin: 9.5 g/dL — ABNORMAL LOW (ref 13.0–17.0)
MCH: 27.6 pg (ref 26.0–34.0)
MCHC: 32.9 g/dL (ref 30.0–36.0)
MCV: 84 fL (ref 80.0–100.0)
Platelets: 344 10*3/uL (ref 150–400)
RBC: 3.44 MIL/uL — ABNORMAL LOW (ref 4.22–5.81)
RDW: 11.9 % (ref 11.5–15.5)
WBC: 8.7 10*3/uL (ref 4.0–10.5)
nRBC: 0 % (ref 0.0–0.2)

## 2019-03-19 LAB — BASIC METABOLIC PANEL
Anion gap: 8 (ref 5–15)
BUN: 11 mg/dL (ref 6–20)
CO2: 22 mmol/L (ref 22–32)
Calcium: 7.9 mg/dL — ABNORMAL LOW (ref 8.9–10.3)
Chloride: 106 mmol/L (ref 98–111)
Creatinine, Ser: 0.73 mg/dL (ref 0.61–1.24)
GFR calc Af Amer: >60 mL/min (ref >60)
GFR calc non Af Amer: >60 mL/min (ref >60)
Glucose, Bld: 142 mg/dL — ABNORMAL HIGH (ref 70–99)
Potassium: 3.6 mmol/L (ref 3.5–5.1)
Sodium: 136 mmol/L (ref 135–145)

## 2019-03-19 LAB — GLUCOSE, CAPILLARY
Glucose-Capillary: 153 mg/dL — ABNORMAL HIGH (ref 70–99)
Glucose-Capillary: 314 mg/dL — ABNORMAL HIGH (ref 70–99)
Glucose-Capillary: 90 mg/dL (ref 70–99)
Glucose-Capillary: 311 mg/dL — ABNORMAL HIGH (ref 70–99)

## 2019-03-19 MED ORDER — INSULIN ASPART 100 UNIT/ML ~~LOC~~ SOLN
3.0000 [IU] | Freq: Three times a day (TID) | SUBCUTANEOUS | Status: DC
  Administered 2019-03-19 – 2019-03-20 (×3): 3 [IU] via SUBCUTANEOUS
  Filled 2019-03-19: qty 1

## 2019-03-19 MED ORDER — PIPERACILLIN-TAZOBACTAM 3.375 G IVPB
3.3750 g | Freq: Three times a day (TID) | INTRAVENOUS | Status: DC
  Administered 2019-03-19 – 2019-03-20 (×3): 3.375 g via INTRAVENOUS
  Filled 2019-03-19 (×3): qty 50

## 2019-03-19 MED ORDER — SODIUM CHLORIDE 0.9 % IV SOLN
INTRAVENOUS | Status: DC | PRN
  Administered 2019-03-19: 250 mL via INTRAVENOUS

## 2019-03-19 NOTE — Progress Notes (Addendum)
PODIATRY / FOOT AND ANKLE SURGERY PROGRESS NOTE  Chief Complaint: R foot puncture wound, abscess, cellulitis   HPI: Adrian Taylor is a 48 y.o. male who presents status post 1 day right foot incision and drainage subsecond metatarsal phalangeal joint with Dr. Ether Griffins.  Patient has kept his dressings clean, dry, and intact since procedure.  Patient has been ambulating with heel contact only in a surgical heel wedge shoe.  Patient states that he has 0/10 pain at this time.  Patient overall states that he is feeling well.  PMHx:  Past Medical History:  Diagnosis Date  . Diabetes mellitus without complication (HCC)   . Hypertension     Surgical Hx:  Past Surgical History:  Procedure Laterality Date  . INCISION AND DRAINAGE Right 03/18/2019   Procedure: INCISION AND DRAINAGE;  Surgeon: Gwyneth Revels, DPM;  Location: [facility] ORS;  Service: Podiatry;  Laterality: Right;  . LUMBAR LAMINECTOMY/DECOMPRESSION MICRODISCECTOMY N/A 08/03/2017   Procedure: LUMBAR LAMINECTOMY/DECOMPRESSION MICRODISCECTOMY L1-4;  Surgeon: Venetia Night, MD;  Location: [facility] ORS;  Service: Neurosurgery;  Laterality: N/A;    FHx: History reviewed. No pertinent family history.  Social History:  reports that he quit smoking about 16 years ago. He has never used smokeless tobacco. He reports current alcohol use. No history on file for drug.  Allergies:  Allergies  Allergen Reactions  . Novolog [Insulin Aspart] Itching    Pt states not allergic     Review of Systems: General ROS: negative Psychological ROS: negative Respiratory ROS: no cough, shortness of breath, or wheezing Cardiovascular ROS: no chest pain or dyspnea on exertion Musculoskeletal ROS: positive for - joint swelling Neurological ROS: positive for - numbness/tingling Dermatological ROS: positive for Incision right plantar foot  Medications Prior to Admission  Medication Sig Dispense Refill  . acetaminophen (TYLENOL) 500 MG tablet Take 500  mg by mouth every 6 (six) hours as needed.    . cephALEXin (KEFLEX) 500 MG capsule Take 500 mg by mouth 3 (three) times daily.    . empagliflozin (JARDIANCE) 10 MG TABS tablet Take 10 mg by mouth daily.    . enalapril (VASOTEC) 20 MG tablet Take 20 mg by mouth 2 (two) times daily.    . insulin glargine (LANTUS) 100 UNIT/ML injection Inject 22 Units into the skin 2 (two) times daily.     . metFORMIN (GLUCOPHAGE) 500 MG tablet Take 1,000 mg by mouth 2 (two) times daily with a meal.    . sulfamethoxazole-trimethoprim (BACTRIM DS) 800-160 MG tablet Take 1 tablet by mouth 2 (two) times daily.      Physical Exam: General: Alert and oriented.  No apparent distress.  Vascular: DP/PT pulses palpable bilateral.  Mild edema and erythema to the right forefoot to midfoot level but decreased overall since admission and procedure.  Neuro: Light touch sensation reduced to bilateral lower extremities.  Derm: Incision site to the plantar aspect of the right psych metatarsophalangeal joint appears to be intact with sutures intact and margins well coapted.  Packing was removed to the central aspect of the incision site with no drainage present.  No odor seen and overall reduced redness and swelling to the extremity.    MSK: 5 out of 5 strength bilateral lower extremities.  Results for orders placed or performed during the hospital encounter of 03/16/19 (from the past 48 hour(s))  Aerobic/Anaerobic Culture (surgical/deep wound)     Status: None (Preliminary result)   Collection Time: 03/17/19  4:16 PM   Specimen: Foot  Result  Value Ref Range   Specimen Description      FOOT Performed at [facility], [address] [place] [place], [place] [address]    Special Requests      RIGHT Performed at [facility], [address] [place], [place] [address]    Gram Stain NO WBC SEEN NO ORGANISMS SEEN     Culture      RARE ENTEROCOCCUS FAECALIS SUSCEPTIBILITIES TO FOLLOW Performed at [facility], [address], [place], [place] [address]    Report Status PENDING   Glucose, capillary     Status: Abnormal   Collection Time: 03/17/19  5:06 PM  Result Value Ref Range   Glucose-Capillary 110 (H) 70 - 99 mg/dL  Glucose, capillary     Status: Abnormal   Collection Time: 03/17/19  8:46 PM  Result Value Ref Range   Glucose-Capillary 277 (H) 70 - 99 mg/dL  MRSA PCR Screening     Status: None   Collection Time: 03/17/19  9:35 PM   Specimen: Nasal Mucosa; Nasopharyngeal  Result Value Ref Range   MRSA by PCR NEGATIVE NEGATIVE    Comment:        The GeneXpert MRSA Assay (FDA approved for NASAL specimens only), is one component of a comprehensive MRSA colonization surveillance program. It is not intended to diagnose MRSA infection nor to guide or monitor treatment for MRSA infections. Performed at [facility], [address] [place], [place] [address]   CBC     Status: Abnormal   Collection Time: 03/18/19  4:52 AM  Result Value Ref Range   WBC 8.3 4.0 - 10.5 K/uL   RBC 3.86 (L) 4.22 - 5.81 MIL/uL   Hemoglobin 10.6 (L) 13.0 - 17.0 g/dL   HCT 29.5 (L) 62.1 - 30.8 %   MCV 84.2 80.0 - 100.0 fL   MCH 27.5 26.0 - 34.0 pg   MCHC 32.6 30.0 - 36.0 g/dL   RDW 65.7 84.6 - 96.2 %   Platelets 355 150 - 400 K/uL   nRBC 0.0 0.0 - 0.2 %    Comment: Performed at [facility], [address] [place], [place] [address]  Basic metabolic panel     Status: Abnormal   Collection Time: 03/18/19  4:52 AM  Result Value Ref Range   Sodium 135 135 - 145 mmol/L   Potassium 3.9 3.5 - 5.1 mmol/L   Chloride 104 98 - 111 mmol/L   CO2 23 22 - 32 mmol/L   Glucose, Bld 203 (H) 70 - 99 mg/dL   BUN 11 6 - 20 mg/dL   Creatinine, Ser 1.32 (L) 0.61 - 1.24 mg/dL   Calcium 8.1 (L) 8.9 - 10.3 mg/dL   GFR calc non Af Amer >60 >60 mL/min   GFR calc Af Amer >60 >60 mL/min   Anion gap 8 5 - 15    Comment: Performed at [facility], [address] [place], [place]  [address]  Glucose, capillary     Status: Abnormal   Collection Time: 03/18/19  7:28 AM  Result Value Ref Range   Glucose-Capillary 207 (H) 70 - 99 mg/dL  Aerobic/Anaerobic Culture (surgical/deep wound)     Status: None (Preliminary result)   Collection Time: 03/18/19  8:26 AM   Specimen: PATH Amputaion Arm/Leg; Tissue  Result Value Ref Range   Specimen Description      TISSUE Performed at [facility], [address], [place], [place] [address]  Special Requests      ARM/LEG Performed at [facility], [place], [place] [address]    Gram Stain      RARE WBC PRESENT, PREDOMINANTLY MONONUCLEAR NO ORGANISMS SEEN Performed at [facility], [place] [address], [place], [place] [address]    Culture PENDING    Report Status PENDING   Glucose, capillary     Status: Abnormal   Collection Time: 03/18/19  9:02 AM  Result Value Ref Range   Glucose-Capillary 136 (H) 70 - 99 mg/dL  Glucose, capillary     Status: Abnormal   Collection Time: 03/18/19 12:03 PM  Result Value Ref Range   Glucose-Capillary 171 (H) 70 - 99 mg/dL   Comment 1 Notify RN   Glucose, capillary     Status: Abnormal   Collection Time: 03/18/19  5:01 PM  Result Value Ref Range   Glucose-Capillary 204 (H) 70 - 99 mg/dL   Comment 1 Notify RN   Glucose, capillary     Status: Abnormal   Collection Time: 03/18/19  9:00 PM  Result Value Ref Range   Glucose-Capillary 226 (H) 70 - 99 mg/dL  CBC     Status: Abnormal   Collection Time: 03/19/19  5:32 AM  Result Value Ref Range   WBC 8.7 4.0 - 10.5 K/uL   RBC 3.44 (L) 4.22 - 5.81 MIL/uL   Hemoglobin 9.5 (L) 13.0 - 17.0 g/dL   HCT 28.9 (L) 39.0 - 52.0 %   MCV 84.0 80.0 - 100.0 fL   MCH 27.6 26.0 - 34.0 pg   MCHC 32.9 30.0 - 36.0 g/dL   RDW 11.9 11.5 - 15.5 %   Platelets 344 150 - 400 K/uL   nRBC 0.0 0.0 - 0.2 %    Comment: Performed at [facility], [address] [place], [place] [address]  Basic metabolic panel      Status: Abnormal   Collection Time: 03/19/19  5:32 AM  Result Value Ref Range   Sodium 136 135 - 145 mmol/L   Potassium 3.6 3.5 - 5.1 mmol/L   Chloride 106 98 - 111 mmol/L   CO2 22 22 - 32 mmol/L   Glucose, Bld 142 (H) 70 - 99 mg/dL   BUN 11 6 - 20 mg/dL   Creatinine, Ser 0.73 0.61 - 1.24 mg/dL   Calcium 7.9 (L) 8.9 - 10.3 mg/dL   GFR calc non Af Amer >60 >60 mL/min   GFR calc Af Amer >60 >60 mL/min   Anion gap 8 5 - 15    Comment: Performed at [facility], [address] [place], [place] [address]  Glucose, capillary     Status: Abnormal   Collection Time: 03/19/19  8:02 AM  Result Value Ref Range   Glucose-Capillary 153 (H) 70 - 99 mg/dL  Glucose, capillary     Status: Abnormal   Collection Time: 03/19/19 12:10 PM  Result Value Ref Range   Glucose-Capillary 314 (H) 70 - 99 mg/dL   Dg Foot Complete Right  Result Date: 03/17/2019 CLINICAL DATA:  Pt states right foot pain for 2 weeks. No known injury. Pt states surgery on right foot EXAM: RIGHT FOOT COMPLETE - 3+ VIEW COMPARISON:  MRI right foot 03/17/2019 FINDINGS: There is no evidence of fracture or dislocation. No significant degenerative changes. No focal bony abnormality. Extensive vascular calcification throughout the ankle and foot. IMPRESSION: 1. No bony abnormality identified in the right foot. The findings of septic arthritis seen  on the same day MR are not apparent. 2.  Extensive vascular calcification. Electronically Signed   By: Emmaline KluverNancy  Ballantyne M.D.   On: 03/17/2019 17:19    Blood pressure (!) 147/81, pulse 98, temperature 98.8 F (37.1 C), temperature source Oral, resp. rate 16, height 5' (1.524 m), weight 52 kg, SpO2 100 %.  Assessment 1. Abscess right foot due to puncture wound status post incision and drainage with closure 2. Uncontrolled diabetes type 2 3. Possible osteomyelitis second metatarsal phalangeal joint  Plan -Examine both feet -Packing removed to the incision line plantar aspect right  second metatarsal phalangeal joint without incident.  Incision site appears to be stable with sutures intact and no further drainage or odor today.  Overall reduction in swelling and erythema present. -Redressed with Betadine soaked iodoform packing gauze, 4 x 4 gauze, Kerlix, Ace wrap.  Recommend dressing changes daily. -Continue partial weightbearing with heel contact only to the right foot and surgical heel wedge shoe.  Appreciate PT recs. -Infectious disease consult has been placed per Dr. Ether GriffinsFowler.  Dr. Ether GriffinsFowler does not believe that there was bone infection present during procedure as no bone appeared to be exposed at the area of the puncture site.  No growth seen with current surgical cultures.  Cultures on 10/10 show Enterococcus faecalis growth.  Appreciate recommendations for antibiotics.   Podiatry will continue to follow peripherally at this point, but when medically stable patient can be discharged.  Orders placed for discharge dressing changes.  Patient will follow up with Dr. Ether GriffinsFowler in 1 week.  Rosetta PosnerAndrew Abrina Petz 03/19/2019, 1:21 PM

## 2019-03-19 NOTE — Progress Notes (Addendum)
Inpatient Diabetes Program Recommendations  AACE/ADA: New Consensus Statement on Inpatient Glycemic Control   Target Ranges:  Prepandial:   less than 140 mg/dL      Peak postprandial:   less than 180 mg/dL (1-2 hours)      Critically ill patients:  140 - 180 mg/dL   Results for Adrian Taylor, Adrian Taylor (MRN [id number]) as of 03/19/2019 08:21  Ref. Range 03/18/2019 07:28 03/18/2019 09:02 03/18/2019 12:03 03/18/2019 17:01 03/18/2019 21:00 03/19/2019 08:02  Glucose-Capillary Latest Ref Range: 70 - 99 mg/dL 207 (H) 136 (H) 171 (H) 204 (H) 226 (H) 153 (H)  Results for Adrian Taylor, Adrian Taylor (MRN [id number]) as of 03/19/2019 08:21  Ref. Range 03/16/2019 13:35  Hemoglobin A1C Latest Ref Range: 4.8 - 5.6 % 14.6 (H)   Review of Glycemic Control  Diabetes history: DM2 Outpatient Diabetes medications: Jardiance 10 mg daily, Metformin 1000 mg BID, Lantus 20-24 units BID Current orders for Inpatient glycemic control: Lantus 10 units daily, Novolog 0-9 units TID with meals, Novolog 0-5 units QHS  Inpatient Diabetes Program Recommendations:   Insulin-Meal Coverage: Please consider ordering Novolog 3 units TID with meals for meal coverage if patient eats at least 50% of meals.  HbgA1C: A1C 14.6% on 03/16/19 indicating an average glucose of 372 mg/dl over the past 2-3 months.  NOTE: Spoke with patient with Ronnald Collum, [place] at [facility]. Patient reports he goes to [facility] for primary care and he takes insulin and 2 oral DM medications for DM.  Patient reports that he takes 20-24 units of the insulin (via insulin pen) and the dose depends on his glucose and how he feels. Patient reports that if his glucose is 130-160 mg/dl he takes 20 units of insulin and if it is higher than 160 mg/dl he takes 24 units of insulin.  In further talking with patient he admits that sometimes he skips or forgets to take the insulin.  Patient admits that he does NOT check glucose very often; stated "If I feel good,  I don't check my sugar. But if I feel bad I check it."   Patient reports recently when he felt bad he checked his glucose and it was over 400 mg/dl.   Discussed A1C results (14.6% on 03/16/19 ) and explained that current A1C indicates an average glucose of 372 mg/dl over the past 2-3 months. Discussed glucose and A1C goals. Discussed importance of checking CBGs and maintaining good CBG control to prevent long-term and short-term complications. Explained how hyperglycemia leads to damage within blood vessels which lead to the common complications seen with uncontrolled diabetes. Stressed to the patient the importance of improving glycemic control to prevent further complications from uncontrolled diabetes especially to promote wound healing with his right foot. Encouraged patient to check his glucose at least 2 times per day and to consistently take DM medications as prescribed. Patient expressed appreciation for information discussed and verbalized understanding of information discussed and reports no further questions at this time related to diabetes.  Thanks, Barnie Alderman, RN, MSN, CDE Diabetes Coordinator Inpatient Diabetes Program [phone] (Team Pager from 8am to 5pm)

## 2019-03-19 NOTE — Progress Notes (Signed)
1        [facility] - [place] at [facility]   PATIENT NAME: Adrian Taylor    MR#:  [id number]  DATE OF BIRTH:  August 27, 1970  SUBJECTIVE:  CHIEF COMPLAINT:   Chief Complaint  Patient presents with  . Recurrent Skin Infections  No new complaints, sugars running high REVIEW OF SYSTEMS:  Review of Systems  Constitutional: Negative for diaphoresis, fever, malaise/fatigue and weight loss.  HENT: Negative for ear discharge, ear pain, hearing loss, nosebleeds, sore throat and tinnitus.   Eyes: Negative for blurred vision and pain.  Respiratory: Negative for cough, hemoptysis, shortness of breath and wheezing.   Cardiovascular: Negative for chest pain, palpitations, orthopnea and leg swelling.  Gastrointestinal: Negative for abdominal pain, blood in stool, constipation, diarrhea, heartburn, nausea and vomiting.  Genitourinary: Negative for dysuria, frequency and urgency.  Musculoskeletal: Negative for back pain and myalgias.  Skin: Positive for rash. Negative for itching.  Neurological: Negative for dizziness, tingling, tremors, focal weakness, seizures, weakness and headaches.  Psychiatric/Behavioral: Negative for depression. The patient is not nervous/anxious.    DRUG ALLERGIES:   Allergies  Allergen Reactions  . Novolog [Insulin Aspart] Itching    Pt states not allergic    VITALS:  Blood pressure (!) 147/81, pulse 98, temperature 98.8 F (37.1 C), temperature source Oral, resp. rate 16, height 5' (1.524 m), weight 52 kg, SpO2 100 %. PHYSICAL EXAMINATION:  Physical Exam HENT:     Head: Normocephalic and atraumatic.  Eyes:     Conjunctiva/sclera: Conjunctivae normal.     Pupils: Pupils are equal, round, and reactive to light.  Neck:     Musculoskeletal: Normal range of motion and neck supple.     Thyroid: No thyromegaly.     Trachea: No tracheal deviation.  Cardiovascular:     Rate and Rhythm: Normal rate and regular rhythm.     Heart sounds:  Normal heart sounds.  Pulmonary:     Effort: Pulmonary effort is normal. No respiratory distress.     Breath sounds: Normal breath sounds. No wheezing.  Chest:     Chest wall: No tenderness.  Abdominal:     General: Bowel sounds are normal. There is no distension.     Palpations: Abdomen is soft.     Tenderness: There is no abdominal tenderness.  Musculoskeletal: Normal range of motion.  Feet:     Comments: Right foot is swollen warm to touch there is an area of entry point on the bottom aspect of the foot Skin:    General: Skin is warm and dry.     Findings: No rash.  Neurological:     Mental Status: He is alert and oriented to person, place, and time.     Cranial Nerves: No cranial nerve deficit.    LABORATORY PANEL:  Male CBC Recent Labs  Lab 03/19/19 0532  WBC 8.7  HGB 9.5*  HCT 28.9*  PLT 344   ------------------------------------------------------------------------------------------------------------------ Chemistries  Recent Labs  Lab 03/16/19 1335  03/19/19 0532  NA 127*   < > 136  K 4.8   < > 3.6  CL 96*   < > 106  CO2 20*   < > 22  GLUCOSE 466*   < > 142*  BUN 18   < > 11  CREATININE 1.01   < > 0.73  CALCIUM 8.1*   < > 7.9*  AST 15  --   --   ALT 10  --   --  ALKPHOS 60  --   --   BILITOT 0.7  --   --    < > = values in this interval not displayed.   RADIOLOGY:  No results found. ASSESSMENT AND PLAN:  Patient is a 48 year old Hispanic male presenting with right foot infection  1.  Right foot infection with cellulitis/osteomyelitis -Switched vancomycin and Unasyn to IV Zosyn. -MRI of the foot confirms osteomyelitis -Status post incision and drainage right foot plantar second MTPJ by podiatry on 10/11 -Discussed with Dr. Vickki Muff who recommends evaluation by infectious disease  2.  Diabetes type 2  -His blood sugars has been running high now -Continue Lantus dose to 10 units -Add NovoLog 3 units 3 times a day with each meals  3.   Accelerated hypertension -resolved now -Pressure well controlled at this time, continue enalapril 20 mg twice a day  4.   Hyponatremia: Resolved with IV hydration   Miscellaneous Lovenox for DVT prophylaxis     All the records are reviewed and case discussed with Care Management/Social Worker. Management plans discussed with the patient, nursing and they are in agreement.  CODE STATUS: Full Code  TOTAL TIME TAKING CARE OF THIS PATIENT: 35 minutes.   More than 50% of the time was spent in counseling/coordination of care: YES  POSSIBLE D/C IN 1-2 DAYS, DEPENDING ON CLINICAL CONDITION.  And podiatry evaluation   Max Sane M.D on 03/19/2019 at 1:10 PM  Between 7am to 6pm - Pager - [phone]  After 6pm go to [email or web address] - [employer] [facility]  Office  [phone]  CC: Primary care physician; Center, [place]  Note: This dictation was prepared with Diplomatic Services operational officer dictation along with smaller phrase technology. Any transcriptional errors that result from this process are unintentional.

## 2019-03-19 NOTE — TOC Progression Note (Signed)
Transition of Care [facility]) - Progression Note    Patient Details  Name: Page Lancon MRN: [id number] Date of Birth: 09/07/1970  Transition of Care [facility] [place]) CM/SW Contact  Shelbie Hutching, RN Phone Number: 03/19/2019, 3:15 PM  Clinical Narrative:    [facility] is unable to accept the patient for home health services due to staffing shortage.    Expected Discharge Plan: [place] Barriers to Discharge: Continued Medical Work up  Expected Discharge Plan and Services Expected Discharge Plan: [place]   Discharge Planning Services: CM Consult Post Acute Care Choice: [place] arrangements for the past 2 months: [place]: [place] ([place]) Date [place]: 03/19/19 Time [place]: 0240 Representative spoke with at [place]: [place] ([place]) Interventions    Readmission Risk Interventions No flowsheet data found.

## 2019-03-19 NOTE — Consult Note (Signed)
NAME: Adrian Taylor  DOB: Nov 15, 1970  MRN: [id number]  Date/Time: 03/19/2019 10:45 AM  REQUESTING PROVIDER: Dr.Fowler Subjective:  REASON FOR CONSULT: Right foot infection ? Adrian Taylor is a 48 y.o. 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  As per patient he had an injury sustained with a piece of metal that cut through his boot and pierced his foot at work.  The foot became infected and he got Keflex at [facility] but the foot continued to worsen  and  he came to the ED. In the ED the vitals were 156/101, pulse of 98 and temperature of 98.4. WBC was 10.7, hemoglobin 10.4 and platelet of 276.  Sodium was 127, glucose 466, chloride of 96 and creatinine of 1.01 and normal LFTs.  He had MRI of the right foot that showed septic arthritis of the second MTP joint with associated osteomyelitis in the head and neck of the second metatarsal and proximal 2 cm of the proximal phalanx of the second toe.  There was skin ulceration on the plantar surface of the foot deep to the second MTP joint with an associated abscess seen.  He was seen by Dr. Ether GriffinsFowler the podiatrist and taken to the OR on 03/18/2019 and underwent incision and drainage of the right foot plantar abscess.  As the capsule was intact and there was no purulent drainage noted from the capsule it was not opened and the bone was not exposed.  Cultures were taken.  Patient is currently on Unasyn and vancomycin.  OR cultures are pending. I am asked to see the patient for further management. Past Medical History:  Diagnosis Date   Diabetes mellitus without complication (HCC)    Hypertension     Past Surgical History:  Procedure Laterality Date   INCISION AND DRAINAGE Right 03/18/2019   Procedure: INCISION AND DRAINAGE;  Surgeon: Gwyneth RevelsFowler,  Justin, DPM;  Location: [facility] ORS;  Service: Podiatry;  Laterality: Right;   LUMBAR LAMINECTOMY/DECOMPRESSION MICRODISCECTOMY N/A 08/03/2017   Procedure: LUMBAR LAMINECTOMY/DECOMPRESSION MICRODISCECTOMY L1-4;  Surgeon: Venetia NightYarbrough, Chester, MD;  Location: [facility] ORS;  Service: Neurosurgery;  Laterality: N/A;    Social History   Socioeconomic History   Marital status: Married    Spouse name: Not on file   Number of children: Not on file   Years of education: Not on file   Highest education level: Not on file  Occupational History   Not on file  Social Needs   Financial resource strain: Not on file   Food insecurity    Worry: Not on file    Inability: Not on file   Transportation needs    Medical: Not on file    Non-medical: Not on file  Tobacco Use   Smoking status: Former Smoker    Quit date: 06/07/2002    Years since quitting: 16.7   Smokeless tobacco: Never Used   Tobacco comment: Last tobacco use 15 years ago  Substance and Sexual Activity   Alcohol use: Yes    Frequency: Never   Drug use: Not on file   Sexual activity: Not on file  Lifestyle   Physical activity    Days per week: Not on file    Minutes per session: Not on file   Stress: Not on file  Relationships   Social connections    Talks  on phone: Not on file    Gets together: Not on file    Attends religious service: Not on file    Active member of club or organization: Not on file    Attends meetings of clubs or organizations: Not on file    Relationship status: Not on file   Intimate partner violence    Fear of current or ex partner: Not on file    Emotionally abused: Not on file    Physically abused: Not on file    Forced sexual activity: Not on file  Other Topics Concern   Not on file  Social History Narrative   Not on file     Patient states there is diabetes in his family.   Allergies  Allergen Reactions   Novolog [Insulin Aspart] Itching    Pt states not allergic     ? Current Facility-Administered Medications  Medication Dose Route Frequency Provider Last Rate Last Dose   0.9 %  sodium chloride infusion   Intravenous Continuous Gwyneth Revels, DPM 75 mL/hr at 03/19/19 0854     acetaminophen (TYLENOL) tablet 500 mg  500 mg Oral Q6H PRN Gwyneth Revels, DPM   500 mg at 03/17/19 2122   Ampicillin-Sulbactam (UNASYN) 3 g in sodium chloride 0.9 % 100 mL IVPB  3 g Intravenous Q6H Gwyneth Revels, DPM   Stopped at 03/19/19 6203   enalapril (VASOTEC) tablet 20 mg  20 mg Oral BID Gwyneth Revels, DPM   20 mg at 03/19/19 0849   enoxaparin (LOVENOX) injection 40 mg  40 mg Subcutaneous Q24H Gwyneth Revels, DPM   40 mg at 03/18/19 2022   HYDROcodone-acetaminophen (NORCO/VICODIN) 5-325 MG per tablet 1-2 tablet  1-2 tablet Oral Q4H PRN Gwyneth Revels, DPM       insulin aspart (novoLOG) injection 0-5 Units  0-5 Units Subcutaneous QHS Gwyneth Revels, DPM   2 Units at 03/18/19 2128   insulin aspart (novoLOG) injection 0-9 Units  0-9 Units Subcutaneous TID WC Gwyneth Revels, DPM   2 Units at 03/19/19 0850   insulin glargine (LANTUS) injection 10 Units  10 Units Subcutaneous Daily Gwyneth Revels, DPM   10 Units at 03/19/19 0850   ketorolac (TORADOL) 15 MG/ML injection 15 mg  15 mg Intravenous Q6H PRN Gwyneth Revels, DPM   15 mg at 03/18/19 1741   metoprolol tartrate (LOPRESSOR) tablet 25 mg  25 mg Oral BID Gwyneth Revels, DPM   25 mg at 03/19/19 0849   ondansetron (ZOFRAN) tablet 4 mg  4 mg Oral Q6H PRN Gwyneth Revels, DPM       Or   ondansetron [facility]) injection 4 mg  4 mg Intravenous Q6H PRN Gwyneth Revels, DPM       povidone-iodine 10 % swab 2 application  2 application Topical Once Gwyneth Revels, DPM       senna-docusate (Senokot-S) tablet 2 tablet  2 tablet Oral BID Delfino Lovett, MD   2 tablet at 03/19/19 0849   vancomycin (VANCOCIN) 1,500 mg in sodium chloride 0.9 % 500 mL IVPB  1,500 mg Intravenous Q24H Gwyneth Revels, DPM   Stopped at 03/18/19 2220      Abtx:  Anti-infectives (From admission, onward)   Start     Dose/Rate Route Frequency Ordered Stop   03/17/19 1800  vancomycin (VANCOCIN) 1,250 mg in sodium chloride 0.9 % 250 mL IVPB  Status:  Discontinued     1,250 mg 166.7 mL/hr over 90 Minutes Intravenous Every 24 hours 03/17/19 0943 03/17/19 0944   03/17/19  1800  vancomycin (VANCOCIN) 1,500 mg in sodium chloride 0.9 % 500 mL IVPB     1,500 mg 250 mL/hr over 120 Minutes Intravenous Every 24 hours 03/17/19 0944     03/17/19 0000  Ampicillin-Sulbactam (UNASYN) 3 g in sodium chloride 0.9 % 100 mL IVPB     3 g 200 mL/hr over 30 Minutes Intravenous Every 6 hours 03/16/19 1753     03/16/19 1800  vancomycin (VANCOCIN) IVPB 1000 mg/200 mL premix  Status:  Discontinued     1,000 mg 200 mL/hr over 60 Minutes Intravenous Every 24 hours 03/16/19 1753 03/17/19 0943   03/16/19 1700  vancomycin (VANCOCIN) IVPB 1000 mg/200 mL premix  Status:  Discontinued     1,000 mg 200 mL/hr over 60 Minutes Intravenous  Once 03/16/19 1657 03/16/19 1752   03/16/19 1700  cefTRIAXone (ROCEPHIN) 2 g in sodium chloride 0.9 % 100 mL IVPB     2 g 200 mL/hr over 30 Minutes Intravenous  Once 03/16/19 1657 03/16/19 1825      REVIEW OF SYSTEMS:  Const: negative fever, negative chills, negative weight loss Eyes: negative diplopia or visual changes, negative eye pain ENT: negative coryza, negative sore throat Resp: negative cough, hemoptysis, dyspnea Cards: negative for chest pain, palpitations, lower extremity edema GU: negative for frequency, dysuria and hematuria GI: Negative for abdominal pain, diarrhea, bleeding, constipation Skin: negative for rash and pruritus Heme: negative for easy bruising and gum/nose bleeding MS: Right foot pain and swelling Neurolo:negative for headaches, dizziness, vertigo, memory problems  Psych: negative for feelings of anxiety, depression  Endocrine: Has poorly controlled diabetes allergy/Immunology- negative for any medication or  food allergies ? Pertinent Positives include : Objective:  VITALS:  BP (!) 147/81 (BP Location: Left Arm)    Pulse 98    Temp 98.8 F (37.1 C) (Oral)    Resp 16    Ht 5' (1.524 m)    Wt 52 kg    SpO2 100%    BMI 22.39 kg/m  PHYSICAL EXAM:  General: Alert, cooperative, no distress, appears stated age.  Head: Normocephalic, without obvious abnormality, atraumatic. Eyes: Conjunctivae clear, anicteric sclerae. Pupils are equal ENT Nares normal. No drainage or sinus tenderness. Lips, mucosa, and tongue normal. No Thrush Neck: Supple, symmetrical, no adenopathy, thyroid: non tender no carotid bruit and no JVD. Back: No CVA tenderness. Lungs: Clear to auscultation bilaterally. No Wheezing or Rhonchi. No rales. Heart: Regular rate and rhythm, no murmur, rub or gallop. Abdomen: Soft, non-tender,not distended. Bowel sounds normal. No masses Extremities:          atraumatic, no cyanosis. No edema. No clubbing Skin: No rashes or lesions. Or bruising Lymph: Cervical, supraclavicular normal. Neurologic: Grossly non-focal Pertinent Labs Lab Results CBC    Component Value Date/Time   WBC 8.7 03/19/2019 0532   RBC 3.44 (L) 03/19/2019 0532   HGB 9.5 (L) 03/19/2019 0532   HCT 28.9 (L) 03/19/2019 0532   PLT 344 03/19/2019 0532   MCV 84.0 03/19/2019 0532   MCH 27.6 03/19/2019 0532   MCHC 32.9 03/19/2019 0532   RDW 11.9 03/19/2019 0532   LYMPHSABS 0.8 03/16/2019 1335   MONOABS 0.9 03/16/2019 1335   EOSABS 0.0 03/16/2019 1335   BASOSABS 0.0 03/16/2019 1335    CMP Latest Ref Rng & Units 03/19/2019 03/18/2019 03/17/2019  Glucose 70 - 99 mg/dL 409(W) 119(J) 478(G)  BUN 6 - 20 mg/dL Creatinine 0.61 - 1.24 mg/dL 9.56 2.13(Y) 8.65(H)  Sodium 135 - 145  mmol/L 136 135 135  Potassium 3.5 - 5.1 mmol/L 3.6 3.9 4.0  Chloride 98 - 111 mmol/L 106 104 104  CO2 22 - 32 mmol/L Calcium 8.9 - 10.3 mg/dL 7.9(L) 8.1(L) 7.9(L)  Total Protein 6.5 - 8.1 g/dL - - -  Total Bilirubin  0.3 - 1.2 mg/dL - - -  Alkaline Phos 38 - 126 U/L - - -  AST 15 - 41 U/L - - -  ALT 0 - 44 U/L - - -      Microbiology: Recent Results (from the past 240 hour(s))  Culture, blood (routine x 2)     Status: None (Preliminary result)   Collection Time: 03/16/19  3:46 PM   Specimen: BLOOD  Result Value Ref Range Status   Specimen Description BLOOD BLOOD LEFT FOREARM  Final   Special Requests   Final    BOTTLES DRAWN AEROBIC AND ANAEROBIC Blood Culture adequate volume   Culture   Final    NO GROWTH 3 DAYS Performed at [facility], [address] [place], [place] [address]    Report Status PENDING  Incomplete  Culture, blood (routine x 2)     Status: None (Preliminary result)   Collection Time: 03/16/19  3:46 PM   Specimen: BLOOD  Result Value Ref Range Status   Specimen Description BLOOD LEFT ANTECUBITAL  Final   Special Requests   Final    BOTTLES DRAWN AEROBIC AND ANAEROBIC Blood Culture adequate volume   Culture   Final    NO GROWTH 3 DAYS Performed at [facility], [address] [place], [place] [address]    Report Status PENDING  Incomplete  SARS CORONAVIRUS 2 (TAT 6-24 HRS) Nasopharyngeal Nasopharyngeal Swab     Status: None   Collection Time: 03/16/19  5:20 PM   Specimen: Nasopharyngeal Swab  Result Value Ref Range Status   SARS Coronavirus 2 NEGATIVE NEGATIVE Final    Comment: (NOTE) SARS-CoV-2 target nucleic acids are NOT DETECTED. The SARS-CoV-2 RNA is generally detectable in upper and lower respiratory specimens during the acute phase of infection. Negative results do not preclude SARS-CoV-2 infection, do not rule out co-infections with other pathogens, and should not be used as the sole basis for treatment or other patient management decisions. Negative results must be combined with clinical observations, patient history, and epidemiological information. The expected result is Negative. Fact Sheet for  Patients: HairSlick.no Fact Sheet for Healthcare Providers: quierodirigir.com This test is not yet approved or cleared by the [place] FDA and  has been authorized for detection and/or diagnosis of SARS-CoV-2 by FDA under an Emergency Use Authorization (EUA). This EUA will remain  in effect (meaning this test can be used) for the duration of the COVID-19 declaration under Section 56 4(b)(1) of the Act, 21 U.S.C. section 360bbb-3(b)(1), unless the authorization is terminated or revoked sooner. Performed at [facility], [address] [place], [place] [address]   Aerobic/Anaerobic Culture (surgical/deep wound)     Status: None (Preliminary result)   Collection Time: 03/17/19  4:16 PM   Specimen: Foot  Result Value Ref Range Status   Specimen Description   Final    FOOT Performed at [facility], [address] [place], [place] [address]    Special Requests   Final    RIGHT Performed at [facility], [address] [place], [place] [address]    Gram Stain NO WBC SEEN NO ORGANISMS SEEN   Final  Culture   Final    RARE ENTEROCOCCUS FAECALIS CULTURE REINCUBATED FOR BETTER GROWTH Performed at [facility], [place] [address], [place], [place] [address]    Report Status PENDING  Incomplete  MRSA PCR Screening     Status: None   Collection Time: 03/17/19  9:35 PM   Specimen: Nasal Mucosa; Nasopharyngeal  Result Value Ref Range Status   MRSA by PCR NEGATIVE NEGATIVE Final    Comment:        The GeneXpert MRSA Assay (FDA approved for NASAL specimens only), is one component of a comprehensive MRSA colonization surveillance program. It is not intended to diagnose MRSA infection nor to guide or monitor treatment for MRSA infections. Performed at [facility], [address] [place], [place] [address]   Aerobic/Anaerobic Culture (surgical/deep wound)     Status: None  (Preliminary result)   Collection Time: 03/18/19  8:26 AM   Specimen: PATH Amputaion Arm/Leg; Tissue  Result Value Ref Range Status   Specimen Description   Final    TISSUE Performed at [facility], [address] [place], [place] [address]    Special Requests   Final    ARM/LEG Performed at [facility], [address] [place], [place] [address]    Gram Stain   Final    RARE WBC PRESENT, PREDOMINANTLY MONONUCLEAR NO ORGANISMS SEEN Performed at [facility], [place] [address] [place], [place] [address]    Culture PENDING  Incomplete   Report Status PENDING  Incomplete    IMAGING RESULTS: MRI foot Findings consistent with septic arthritis of the second MTP joint with associated osteomyelitis in the head and neck of the second metatarsal and proximal 2 cm of the proximal phalanx of the second toe.  Skin ulceration on the plantar surface of the foot deep to the second MTP joint with an associated abscess in the subcutaneous tissues as described above. I have personally reviewed the films ? Impression/Recommendation ? ?48 year old male with history of diabetes and hypertension. Right foot infection from a puncture wound sustained at work to a metal piercing his foot through his boot.  Underwent IND of the collection at the level of the second metatarsal.  As per Dr. Alvera Singh note the capsule of the joint was intact and there was no evidence of septic arthritis.  Cultures pending.  He was on Unasyn and vancomycin and I changed to Zosyn.  If the cultures do not reveal any Pseudomonas then we can switch him back to Unasyn or p.o. Augmentin.  Diabetes mellitus on insulin  Hypertension on enalapril. ? ___________________________________________________ Discussed with patient, and requesting provider Note:  This document was prepared using Dragon voice recognition software and may include unintentional dictation errors.

## 2019-03-19 NOTE — TOC Initial Note (Signed)
Transition of Care [facility]) - Initial/Assessment Note    Patient Details  Name: Adrian Taylor MRN: [id number] Date of Birth: 02-25-1971  Transition of [facility]) CM/SW Contact:    Shelbie Hutching, RN Phone Number: 03/19/2019, 3:07 PM  Clinical Narrative:                 Patient admitted with cellulitis of right foot, patient reports that he stepped on some metal at work and the wound became infected.  Initial antibiotics were ineffective.  Patient required I&D yesterday and will need daily dressing changes at home.  Patient lives with his wife and children, his wife will be able to help with dressing changes at home.  Patient's family will provide transportation to all follow up appointments, patient will benefit from home health RN and PT, referral given to [facility] with [place].   RNCM completed assessment with Spanish interpreter assistance.  Expected Discharge Plan: [place] Barriers to Discharge: Continued Medical Work up   Patient Goals and CMS Choice   CMS Medicare.gov Compare Post Acute Care list provided to:: Patient Choice offered to / list presented to : Patient  Expected Discharge Plan and Services Expected Discharge Plan: [place]   Discharge Planning Services: CM Consult Post Acute Care Choice: [place] arrangements for the past 2 months: [place]: [place] ([place]) Date [place]: 03/19/19 Time [place]: 4401 Representative spoke with at [place]: Isac Caddy  Prior Living Arrangements/Services Living arrangements for the past 2 months: [place] with:: Spouse, Minor Children Patient language and need for interpreter reviewed:: Yes(Spanish) Do you feel safe going back to the place where you live?: Yes      Need for Family Participation in Patient Care: Yes (Comment)(daily  wound care) Care giver support system in place?: Yes (comment)(wife and children)   Criminal Activity/Legal Involvement Pertinent to Current Situation/Hospitalization: No - Comment as needed  Activities of Daily Living Home Assistive Devices/Equipment: None ADL Screening (condition at time of admission) Patient's cognitive ability adequate to safely complete daily activities?: Yes Is the patient deaf or have difficulty hearing?: No Does the patient have difficulty seeing, even when wearing glasses/contacts?: No Does the patient have difficulty concentrating, remembering, or making decisions?: No Patient able to express need for assistance with ADLs?: No Does the patient have difficulty dressing or bathing?: No Independently performs ADLs?: Yes (appropriate for developmental age) Does the patient have difficulty walking or climbing stairs?: No Weakness of Legs: None Weakness of Arms/Hands: None  Permission Sought/Granted Permission sought to share information with : Case Manager, Other (comment) Permission granted to share information with : Yes, Verbal Permission Granted     Permission granted to share info w AGENCY: Home Health agency of choice        Emotional Assessment Appearance:: Appears stated age Attitude/Demeanor/Rapport: Engaged Affect (typically observed): Accepting Orientation: : Oriented to Self, Oriented to Place, Oriented to  Time, Oriented to Situation Alcohol / Substance Use: Not Applicable Psych Involvement: No (comment)  Admission diagnosis:  Cellulitis of right lower extremity [L03.115] Patient Active Problem List   Diagnosis Date Noted  . Cellulitis 03/16/2019  . 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 [place] [address] Phone: [phone] Fax: [phone]     Social Determinants of Health  (SDOH) Interventions    Readmission Risk Interventions No flowsheet data found.

## 2019-03-19 NOTE — Evaluation (Addendum)
Physical Therapy Evaluation Patient Details Name: Adrian Taylor MRN: [id number] DOB: 01-01-1971 Today's Date: 03/19/2019   History of Present Illness  Pt is a 48 y/o M admitted for cellulitis in R foot after contact with metal; s/p incision and drainage by podiatry 10/11; consulted with MD via secure chat to clarify NWB status on R LE. PMH includes HTN, DM.  Clinical Impression  Pt is a pleasant 48 year old M who was admitted s/p R foot incision and drainage. Upon entry, pt is resting in bed. PT enters with translator, Leola Brazil. Pt performs bed mobility independently and transfers/ ambulation with supervision from PT and use of RW. Pt should attempt amb with crutches during next session. Pt demonstrates deficits with strength/mobility/ROM. Pt will benefit from skilled PT to address above deficits; current follow-up care recommendation is OPPT.      Follow Up Recommendations Outpatient PT    Equipment Recommendations  Rolling walker with 5" wheels(Pt to attempt crutches at next session)    Recommendations for Other Services       Precautions / Restrictions Precautions Precautions: Other (comment)(mod fall) Restrictions Weight Bearing Restrictions: Yes RLE Weight Bearing: Non weight bearing      Mobility  Bed Mobility Overal bed mobility: Independent             General bed mobility comments: Pt able to perform bed mobility tasks without hesitation and without assist from PT  Transfers Overall transfer level: Needs assistance Equipment used: Rolling walker (2 wheeled) Transfers: Sit to/from Stand Sit to Stand: Supervision         General transfer comment: Pt able to perform transfers with supervision from PT; safely without WB on R LE  Ambulation/Gait Ambulation/Gait assistance: Supervision Gait Distance (Feet): 200 Feet Assistive device: Rolling walker (2 wheeled) Gait Pattern/deviations: (Hop-to pattern)     General Gait Details: Pt provided  demonstration on how to amb using RW with hop-to pattern. Able to perform safely; reports minimal fatigue after 200 ft  Stairs            Wheelchair Mobility    Modified Rankin (Stroke Patients Only)       Balance Overall balance assessment: Needs assistance Sitting-balance support: Feet unsupported;No upper extremity supported Sitting balance-Leahy Scale: Good Sitting balance - Comments: Able to maintain upright balance without support from UE or LE   Standing balance support: Bilateral upper extremity supported Standing balance-Leahy Scale: Good Standing balance comment: Able to perform standing balance with amb task using RW                             Pertinent Vitals/Pain Pain Assessment: Faces Faces Pain Scale: Hurts a little bit Pain Location: R Foot Pain Descriptors / Indicators: Dull;Aching Pain Intervention(s): Limited activity within patient's tolerance;Monitored during session    Home Living Family/patient expects to be discharged to:: Private residence Living Arrangements: Spouse/significant other;Children Available Help at Discharge: Family;Available PRN/intermittently Type of Home: House Home Access: Stairs to enter Entrance Stairs-Rails: None Entrance Stairs-Number of Steps: 3 Home Layout: One level Home Equipment: Walker - standard Additional Comments: Pt reports he was not using AD prior to admission; family is around to help intermittently    Prior Function Level of Independence: Independent         Comments: Pt able to perform ADL's independently prior to admission     Hand Dominance        Extremity/Trunk Assessment   Upper Extremity  Assessment Upper Extremity Assessment: Overall WFL for tasks assessed    Lower Extremity Assessment Lower Extremity Assessment: RLE deficits/detail RLE Deficits / Details: s/p incision and drainage; NWB       Communication   Communication: Interpreter utilized(Jacqui Laukaitis)   Cognition Arousal/Alertness: Awake/alert Behavior During Therapy: WFL for tasks assessed/performed Overall Cognitive Status: Within Functional Limits for tasks assessed                                        General Comments      Exercises Other Exercises Other Exercises: Supine, 10 reps, bilat: AP, SLR, QS Other Exercises: Seated EOB, 10 reps, bilateral: march Other Exercises: Pt provided education on his NWB precautions and amb using RW to accommodate them   Assessment/Plan    PT Assessment Patient needs continued PT services  PT Problem List Decreased strength;Decreased mobility;Decreased knowledge of use of DME;Decreased balance;Pain       PT Treatment Interventions DME instruction;Gait training;Stair training;Functional mobility training;Therapeutic activities;Therapeutic exercise;Balance training;Patient/family education    PT Goals (Current goals can be found in the Care Plan section)  Acute Rehab PT Goals Patient Stated Goal: to return home PT Goal Formulation: With patient Time For Goal Achievement: 04/02/19 Potential to Achieve Goals: Good    Frequency Min 2X/week   Barriers to discharge        Co-evaluation               AM-PAC PT "6 Clicks" Mobility  Outcome Measure Help needed turning from your back to your side while in a flat bed without using bedrails?: None Help needed moving from lying on your back to sitting on the side of a flat bed without using bedrails?: None Help needed moving to and from a bed to a chair (including a wheelchair)?: A Little Help needed standing up from a chair using your arms (e.g., wheelchair or bedside chair)?: A Little Help needed to walk in hospital room?: A Little Help needed climbing 3-5 steps with a railing? : A Little 6 Click Score: 20    End of Session Equipment Utilized During Treatment: Gait belt Activity Tolerance: Patient tolerated treatment well Patient left: in chair;with chair alarm  set;with call bell/phone within reach Nurse Communication: Mobility status PT Visit Diagnosis: Difficulty in walking, not elsewhere classified (R26.2);Muscle weakness (generalized) (M62.81);Pain Pain - Right/Left: Right Pain - part of body: Ankle and joints of foot    Time: 8088-1103 PT Time Calculation (min) (ACUTE ONLY): 31 min   Charges:   PT Evaluation $PT Eval Low Complexity: 1 Low PT Treatments $Therapeutic Exercise: 8-22 mins       Zareya Tuckett, SPT   Latriece Anstine 03/19/2019, 12:59 PM

## 2019-03-20 LAB — CBC
HCT: 29.7 % — ABNORMAL LOW (ref 39.0–52.0)
Hemoglobin: 9.9 g/dL — ABNORMAL LOW (ref 13.0–17.0)
MCH: 27.6 pg (ref 26.0–34.0)
MCHC: 33.3 g/dL (ref 30.0–36.0)
MCV: 82.7 fL (ref 80.0–100.0)
Platelets: 387 10*3/uL (ref 150–400)
RBC: 3.59 MIL/uL — ABNORMAL LOW (ref 4.22–5.81)
RDW: 11.5 % (ref 11.5–15.5)
WBC: 7 10*3/uL (ref 4.0–10.5)
nRBC: 0 % (ref 0.0–0.2)

## 2019-03-20 LAB — BASIC METABOLIC PANEL
Anion gap: 8 (ref 5–15)
BUN: 13 mg/dL (ref 6–20)
CO2: 24 mmol/L (ref 22–32)
Calcium: 8.2 mg/dL — ABNORMAL LOW (ref 8.9–10.3)
Chloride: 103 mmol/L (ref 98–111)
Creatinine, Ser: 0.7 mg/dL (ref 0.61–1.24)
GFR calc Af Amer: >60 mL/min (ref >60)
GFR calc non Af Amer: >60 mL/min (ref >60)
Glucose, Bld: 209 mg/dL — ABNORMAL HIGH (ref 70–99)
Potassium: 3.6 mmol/L (ref 3.5–5.1)
Sodium: 135 mmol/L (ref 135–145)

## 2019-03-20 LAB — GLUCOSE, CAPILLARY
Glucose-Capillary: 192 mg/dL — ABNORMAL HIGH (ref 70–99)
Glucose-Capillary: 150 mg/dL — ABNORMAL HIGH (ref 70–99)

## 2019-03-20 MED ORDER — INSULIN GLARGINE 100 UNIT/ML ~~LOC~~ SOLN
12.0000 [IU] | Freq: Every day | SUBCUTANEOUS | Status: DC
  Administered 2019-03-20: 10:00:00 12 [IU] via SUBCUTANEOUS
  Filled 2019-03-20 (×2): qty 0.12

## 2019-03-20 MED ORDER — INSULIN ASPART 100 UNIT/ML ~~LOC~~ SOLN
5.0000 [IU] | Freq: Three times a day (TID) | SUBCUTANEOUS | Status: DC
  Administered 2019-03-20: 5 [IU] via SUBCUTANEOUS
  Filled 2019-03-20: qty 1

## 2019-03-20 MED ORDER — AMOXICILLIN-POT CLAVULANATE 875-125 MG PO TABS: 1.0000 | ORAL_TABLET | Freq: Two times a day (BID) | ORAL | 0 refills | Status: AC

## 2019-03-20 MED ORDER — AMOXICILLIN-POT CLAVULANATE 875-125 MG PO TABS: 1.0000 | ORAL_TABLET | Freq: Two times a day (BID) | ORAL | 0 refills | Status: DC

## 2019-03-20 MED ORDER — PROBIOTIC 250 MG PO CAPS: 1.0000 | ORAL_CAPSULE | Freq: Two times a day (BID) | ORAL | 0 refills | Status: DC

## 2019-03-20 NOTE — TOC Benefit Eligibility Note (Signed)
Transition of Care [facility]) Benefit Eligibility Note    Patient Details  Name: Adrian Taylor MRN: [id number] Date of Birth: 1971-03-12  Home Health Benefits  Number called: [phone] Rep: Quita Skye Reference Number: [id number]  Deductible: $1,000 with  $21.49 met as of time of call Out of Pocket Max: $5,000 with $288.41 met as of time of call  Home Health: After deductible satisfied, responsible for 20% co-insurance. Per rep, no visit limit.  Prior approval required for private duty nursing.  No specific benefit listed for wound care.  Number to start PA: [phone].       Dannette Barbara Phone Number: 03/20/2019, 9:56 AM

## 2019-03-20 NOTE — Progress Notes (Signed)
Discussed discharge instructions with patient via spanish interpretor.  All questions addressed.  Clarise Cruz, BSN

## 2019-03-20 NOTE — TOC Transition Note (Signed)
Transition of Care [facility]) - CM/SW Discharge Note   Patient Details  Name: Adrian Taylor MRN: [id number] Date of Birth: 1971/05/26  [facility] CM/SW Contact:  Shelbie Hutching, RN Phone Number: 03/20/2019, 3:08 PM   Clinical Narrative:    Patient is medically ready for discharge, home health arranged with [facility] for RN and PT.  Rolling walker to be delivered to the room.  Patient's wife will provide transportation home.  Bedside RN changed wound dressing this afternoon, orders for daily wound care are in.  Cory with [facility] aware of discharge.     Final next level of care: Home w Home Health Services Barriers to Discharge: Barriers Resolved   Patient Goals and CMS Choice   CMS Medicare.gov Compare Post Acute Care list provided to:: Patient Choice offered to / list presented to : Patient  Discharge Placement                       Discharge Plan and Services   Discharge Planning Services: CM Consult Post Acute Care Choice: Home Health                    HH Arranged: RN, PT [facility] Agency: [place] Date [place]: 03/20/19 Time [place]: 1133 Representative spoke with at [place]: Beryle Beams  Social Determinants of Health (SDOH) Interventions     Readmission Risk Interventions No flowsheet data found.

## 2019-03-20 NOTE — Discharge Summary (Signed)
[employer] - [place] at [facility]   PATIENT NAME: Adrian Taylor    MR#:  [id number]  DATE OF BIRTH:  Mar 10, 1971  DATE OF ADMISSION:  03/16/2019   ADMITTING PHYSICIAN: Auburn Bilberry, MD  DATE OF DISCHARGE: 03/20/2019  4:32 PM  PRIMARY CARE PHYSICIAN: [facility]   ADMISSION DIAGNOSIS:  Cellulitis of right lower extremity [L03.115] DISCHARGE DIAGNOSIS:  Active Problems:   Cellulitis  SECONDARY DIAGNOSIS:   Past Medical History:  Diagnosis Date  . Diabetes mellitus without complication (HCC)   . Hypertension    HOSPITAL COURSE:  Patient is a 48 year old Hispanic male presenting with right foot infection  1.Right foot infection with cellulitis/osteomyelitis -treated with IV Abx while in the Hospital -MRI of the foot confirms osteomyelitis -Status post incision and drainage right foot plantar second MTPJ by podiatry on 10/11  2.Diabetes type 2  -controlled  3.Accelerated hypertension -resolved - enalapril 20 mg twice a day  4.  Hyponatremia: Resolved with IV hydration  DISCHARGE CONDITIONS:  stable CONSULTS OBTAINED:   DRUG ALLERGIES:   Allergies  Allergen Reactions  . Novolog [Insulin Aspart] Itching    Pt states not allergic    DISCHARGE MEDICATIONS:   Allergies as of 03/20/2019      Reactions   Novolog [insulin Aspart] Itching   Pt states not allergic       Medication List    STOP taking these medications   cephALEXin 500 MG capsule Commonly known as: KEFLEX   sulfamethoxazole-trimethoprim 800-160 MG tablet Commonly known as: BACTRIM DS     TAKE these medications   acetaminophen 500 MG tablet Commonly known as: TYLENOL Take 500 mg by mouth every 6 (six) hours as needed.   amoxicillin-clavulanate 875-125 MG tablet Commonly known as: Augmentin Take 1 tablet by mouth every 12 (twelve) hours for 10 days.   enalapril 20 MG tablet Commonly known as: VASOTEC Take 20 mg by  mouth 2 (two) times daily.   insulin glargine 100 UNIT/ML injection Commonly known as: LANTUS Inject 22 Units into the skin 2 (two) times daily.   Jardiance 10 MG Tabs tablet Generic drug: empagliflozin Take 10 mg by mouth daily.   metFORMIN 500 MG tablet Commonly known as: GLUCOPHAGE Take 1,000 mg by mouth 2 (two) times daily with a meal.   Probiotic 250 MG Caps Take 1 capsule by mouth 2 (two) times daily.        DISCHARGE INSTRUCTIONS:  1.  Change dressings daily consisting of removal of iodoform packing gauze.  Place 1/2 inch Iodosorb packing gauze into the open wound followed by application of Xeroform to the incision site, 4 x 4 gauze, Kerlix, Ace wrap. 2.  Continue partial weightbearing with heel contact only to the right foot and the surgical heel wedge shoe.  Try to stay off of the right foot as much as possible 3.  Take antibiotics and pain medication as prescribed if prescribed. 4.  Follow-up with Dr. Ether Griffins in 1 week after discharge at [facility] clinic. DIET:  Diabetic diet DISCHARGE CONDITION:  Stable ACTIVITY:  Activity as tolerated OXYGEN:  Home Oxygen: No.  Oxygen Delivery: room air DISCHARGE LOCATION:  Home with home health   If you experience worsening of your admission symptoms, develop shortness of breath, life threatening emergency, suicidal or homicidal thoughts you must seek medical attention immediately by calling 911 or calling your MD immediately  if symptoms less severe.  You Must read complete instructions/literature along with all the  possible adverse reactions/side effects for all the Medicines you take and that have been prescribed to you. Take any new Medicines after you have completely understood and accpet all the possible adverse reactions/side effects.   Please note  You were cared for by a hospitalist during your hospital stay. If you have any questions about your discharge medications or the care you received while you were in the  hospital after you are discharged, you can call the unit and asked to speak with the hospitalist on call if the hospitalist that took care of you is not available. Once you are discharged, your primary care physician will handle any further medical issues. Please note that NO REFILLS for any discharge medications will be authorized once you are discharged, as it is imperative that you return to your primary care physician (or establish a relationship with a primary care physician if you do not have one) for your aftercare needs so that they can reassess your need for medications and monitor your lab values.    On the day of Discharge:  VITAL SIGNS:  Blood pressure (!) 145/93, pulse 92, temperature 98.6 F (37 C), temperature source Oral, resp. rate 16, height 5' (1.524 m), weight 52 kg, SpO2 100 %. PHYSICAL EXAMINATION:  GENERAL:  48 y.o.-year-old patient lying in the bed with no acute distress.  EYES: Pupils equal, round, reactive to light and accommodation. No scleral icterus. Extraocular muscles intact.  HEENT: Head atraumatic, normocephalic. Oropharynx and nasopharynx clear.  NECK:  Supple, no jugular venous distention. No thyroid enlargement, no tenderness.  LUNGS: Normal breath sounds bilaterally, no wheezing, rales,rhonchi or crepitation. No use of accessory muscles of respiration.  CARDIOVASCULAR: S1, S2 normal. No murmurs, rubs, or gallops.  ABDOMEN: Soft, non-tender, non-distended. Bowel sounds present. No organomegaly or mass.  EXTREMITIES: No pedal edema, cyanosis, or clubbing.  NEUROLOGIC: Cranial nerves II through XII are intact. Muscle strength 5/5 in all extremities. Sensation intact. Gait not checked.  PSYCHIATRIC: The patient is alert and oriented x 3.  SKIN: No obvious rash, lesion, or ulcer.  DATA REVIEW:   CBC Recent Labs  Lab 03/20/19 0549  WBC 7.0  HGB 9.9*  HCT 29.7*  PLT 387    Chemistries  Recent Labs  Lab 03/16/19 1335  03/20/19 0549  NA 127*   < > 135   K 4.8   < > 3.6  CL 96*   < > 103  CO2 20*   < > 24  GLUCOSE 466*   < > 209*  BUN 18   < > 13  CREATININE 1.01   < > 0.70  CALCIUM 8.1*   < > 8.2*  AST 15  --   --   ALT 10  --   --   ALKPHOS 60  --   --   BILITOT 0.7  --   --    < > = values in this interval not displayed.     Microbiology Results  Results for orders placed or performed during the hospital encounter of 03/16/19  Culture, blood (routine x 2)     Status: None (Preliminary result)   Collection Time: 03/16/19  3:46 PM   Specimen: BLOOD  Result Value Ref Range Status   Specimen Description BLOOD BLOOD LEFT FOREARM  Final   Special Requests   Final    BOTTLES DRAWN AEROBIC AND ANAEROBIC Blood Culture adequate volume   Culture   Final    NO GROWTH 4 DAYS Performed at [facility], [address], [place], [place] [address]    Report Status PENDING  Incomplete  Culture, blood (routine x 2)     Status: None (Preliminary result)   Collection Time: 03/16/19  3:46 PM   Specimen: BLOOD  Result Value Ref Range Status   Specimen Description BLOOD LEFT ANTECUBITAL  Final   Special Requests   Final    BOTTLES DRAWN AEROBIC AND ANAEROBIC Blood Culture adequate volume   Culture   Final    NO GROWTH 4 DAYS Performed at [facility], [address] [place], [place] [address]    Report Status PENDING  Incomplete  SARS CORONAVIRUS 2 (TAT 6-24 HRS) Nasopharyngeal Nasopharyngeal Swab     Status: None   Collection Time: 03/16/19  5:20 PM   Specimen: Nasopharyngeal Swab  Result Value Ref Range Status   SARS Coronavirus 2 NEGATIVE NEGATIVE Final    Comment: (NOTE) SARS-CoV-2 target nucleic acids are NOT DETECTED. The SARS-CoV-2 RNA is generally detectable in upper and lower respiratory specimens during the acute phase of infection. Negative results do not preclude SARS-CoV-2 infection, do not rule out co-infections with other pathogens, and should not be used as the sole basis for treatment or other  patient management decisions. Negative results must be combined with clinical observations, patient history, and epidemiological information. The expected result is Negative. Fact Sheet for Patients: HairSlick.no Fact Sheet for Healthcare Providers: quierodirigir.com This test is not yet approved or cleared by the [place] FDA and  has been authorized for detection and/or diagnosis of SARS-CoV-2 by FDA under an Emergency Use Authorization (EUA). This EUA will remain  in effect (meaning this test can be used) for the duration of the COVID-19 declaration under Section 56 4(b)(1) of the Act, 21 U.S.C. section 360bbb-3(b)(1), unless the authorization is terminated or revoked sooner. Performed at [facility], [address] [place], [place] [address]   Aerobic/Anaerobic Culture (surgical/deep wound)     Status: None (Preliminary result)   Collection Time: 03/17/19  4:16 PM   Specimen: Foot  Result Value Ref Range Status   Specimen Description   Final    FOOT Performed at [facility], [address] [place], [place] [address]    Special Requests   Final    RIGHT Performed at [facility], [address] [place], [place] [address]    Gram Stain   Final    NO WBC SEEN NO ORGANISMS SEEN Performed at [facility], [address], [place], [place] [address]    Culture RARE ENTEROCOCCUS FAECALIS  Final   Report Status PENDING  Incomplete   Organism ID, Bacteria ENTEROCOCCUS FAECALIS  Final      Susceptibility   Enterococcus faecalis - MIC*    AMPICILLIN <=2 SENSITIVE Sensitive     VANCOMYCIN 1 SENSITIVE Sensitive     GENTAMICIN SYNERGY SENSITIVE Sensitive     * RARE ENTEROCOCCUS FAECALIS  MRSA PCR Screening     Status: None   Collection Time: 03/17/19  9:35 PM   Specimen: Nasal Mucosa; Nasopharyngeal  Result Value Ref Range Status   MRSA by PCR NEGATIVE NEGATIVE Final    Comment:         The GeneXpert MRSA Assay (FDA approved for NASAL specimens only), is one component of a comprehensive MRSA colonization surveillance program. It is not intended to diagnose MRSA infection nor to guide or monitor treatment for MRSA infections. Performed at [facility], [address] [place], [place] [address]   Aerobic/Anaerobic Culture (surgical/deep wound)     Status: None (Preliminary result)   Collection Time: 03/18/19  8:26 AM   Specimen: PATH Amputaion Arm/Leg; Tissue  Result Value Ref Range Status   Specimen Description   Final    TISSUE Performed at [facility], [address] [place], [place] [address]    Special Requests   Final    ARM/LEG Performed at [facility], [address] [place], [place] [address]    Gram Stain   Final    RARE WBC PRESENT, PREDOMINANTLY MONONUCLEAR NO ORGANISMS SEEN Performed at [facility], [address] [place], [place] [address]    Culture   Final    RARE ENTEROCOCCUS FAECALIS NO ANAEROBES ISOLATED; CULTURE IN PROGRESS FOR 5 DAYS    Report Status PENDING  Incomplete   Organism ID, Bacteria ENTEROCOCCUS FAECALIS  Final      Susceptibility   Enterococcus faecalis - MIC*    AMPICILLIN <=2 SENSITIVE Sensitive     VANCOMYCIN 1 SENSITIVE Sensitive     GENTAMICIN SYNERGY SENSITIVE Sensitive     * RARE ENTEROCOCCUS FAECALIS    RADIOLOGY:  No results found.   Management plans discussed with the patient, family and they are in agreement.  CODE STATUS: Prior   TOTAL TIME TAKING CARE OF THIS PATIENT: 45 minutes.    Delfino LovettVipul Luay Balding M.D on 03/20/2019 at 9:15 PM  Between 7am to 6pm - Pager - [phone]  After 6pm go to [email or web address] - Scientist, research (life sciences)password [email or web address] [email or web address]  [facility] [place] [facility]  Office  [phone]  CC: Primary care physician; [facility]   Note: This dictation was prepared with Nurse, children'sDragon dictation along with smaller phrase technology. Any  transcriptional errors that result from this process are unintentional.

## 2019-03-20 NOTE — Progress Notes (Signed)
Inpatient Diabetes Program Recommendations  AACE/ADA: New Consensus Statement on Inpatient Glycemic Control   Target Ranges:  Prepandial:   less than 140 mg/dL      Peak postprandial:   less than 180 mg/dL (1-2 hours)      Critically ill patients:  140 - 180 mg/dL   Results for Adrian Taylor, Adrian Taylor (MRN [id number]) as of 03/20/2019 07:45  Ref. Range 03/19/2019 08:02 03/19/2019 12:10 03/19/2019 17:11 03/19/2019 21:01 03/20/2019 07:27  Glucose-Capillary Latest Ref Range: 70 - 99 mg/dL 153 (H)  Novolog 2 units  Lantus 10 units 314 (H)  Novolog 10 units 90  Novolog 3 units 311 (H)  Novolog 4 units 192 (H)   Review of Glycemic Control  Diabetes history: DM2 Outpatient Diabetes medications: Jardiance 10 mg daily, Metformin 1000 mg BID, Lantus 20-24 units BID Current orders for Inpatient glycemic control: Lantus 10 units daily, Novolog 0-9 units TID with meals, Novolog 0-5 units QHS, Novolog 3 units TID with meals  Inpatient Diabetes Program Recommendations:   Insulin-Basal: Please consider increasing Lantus to 12 units daily.  Insulin-Meal Coverage: Please consider increasing meal coverage to Novolog 5 units TID with meals if patient eats at least 50% of meals.  HbgA1C: A1C 14.6% on 03/16/19 indicating an average glucose of 372 mg/dl over the past 2-3 months. At time of discharge, may want to consider adjusting Lantus dose to same dose being given while inpatient.  Thanks, Barnie Alderman, RN, MSN, CDE Diabetes Coordinator Inpatient Diabetes Program [phone] (Team Pager from 8am to 5pm)

## 2019-03-20 NOTE — Progress Notes (Signed)
Physical Therapy Treatment Patient Details Name: Adrian Taylor MRN: [id number] DOB: 09/16/1970 Today's Date: 03/20/2019    History of Present Illness Pt is a 48 y/o M admitted for cellulitis in R foot after contact with metal; s/p incision and drainage by podiatry 10/11; consulted with MD via secure chat to clarify NWB status on R LE. PMH includes HTN, DM.    PT Comments    Pt is making good progress towards goals with ability to maintain correct WBing status this date. Stair training performed backwards with use of RW. Safe technique. Crutches trialed, however pt performs safer ambulation with RW. Notified CM that pt will need RW for discharge. Currently met PT goals, continue to recommend OP PT when allowed to WB if needed. Will dc in house. Jacqui used for interpreter.   Follow Up Recommendations  Outpatient PT     Equipment Recommendations  Rolling walker with 5" wheels    Recommendations for Other Services       Precautions / Restrictions Precautions Precautions: None Restrictions Weight Bearing Restrictions: Yes RLE Weight Bearing: Non weight bearing    Mobility  Bed Mobility Overal bed mobility: Independent             General bed mobility comments: safe technique  Transfers Overall transfer level: Needs assistance Equipment used: Rolling walker (2 wheeled) Transfers: Sit to/from Stand Sit to Stand: Supervision         General transfer comment: Safe technique performing transfers  Ambulation/Gait Ambulation/Gait assistance: Supervision Gait Distance (Feet): 50 Feet Assistive device: Crutches Gait Pattern/deviations: Step-to pattern     General Gait Details: Pt trialed B axillary crutches and was able to ambulate in room. Pt slightly unsteady and demonstrated improved balance with use of RW.   Stairs Stairs: Yes Stairs assistance: Min guard Stair Management: No rails Number of Stairs: 3 General stair comments: ambulated up/down 3  steps without rails backward using RW. Demonstrated prior to performance. Safe technique   Wheelchair Mobility    Modified Rankin (Stroke Patients Only)       Balance Overall balance assessment: Needs assistance Sitting-balance support: Feet unsupported;No upper extremity supported Sitting balance-Leahy Scale: Good Sitting balance - Comments: Able to maintain upright balance without support from UE or LE   Standing balance support: Bilateral upper extremity supported Standing balance-Leahy Scale: Good                              Cognition Arousal/Alertness: Awake/alert Behavior During Therapy: WFL for tasks assessed/performed Overall Cognitive Status: Within Functional Limits for tasks assessed                                        Exercises      General Comments        Pertinent Vitals/Pain Pain Assessment: 0-10 Pain Score: 3  Pain Location: R Foot Pain Descriptors / Indicators: Dull;Aching Pain Intervention(s): Limited activity within patient's tolerance;Repositioned    Home Living                      Prior Function            PT Goals (current goals can now be found in the care plan section) Acute Rehab PT Goals Patient Stated Goal: to return home PT Goal Formulation: With patient Time For Goal Achievement: 04/02/19 Potential   to Achieve Goals: Good Progress towards PT goals: Goals met/education completed, patient discharged from PT    Frequency    (dc inhouse)      PT Plan Frequency needs to be updated    Co-evaluation              AM-PAC PT "6 Clicks" Mobility   Outcome Measure  Help needed turning from your back to your side while in a flat bed without using bedrails?: None Help needed moving from lying on your back to sitting on the side of a flat bed without using bedrails?: None Help needed moving to and from a bed to a chair (including a wheelchair)?: None Help needed standing up from a  chair using your arms (e.g., wheelchair or bedside chair)?: None Help needed to walk in hospital room?: None Help needed climbing 3-5 steps with a railing? : A Little 6 Click Score: 23    End of Session Equipment Utilized During Treatment: Gait belt Activity Tolerance: Patient tolerated treatment well Patient left: in chair;with chair alarm set;with call bell/phone within reach Nurse Communication: Mobility status PT Visit Diagnosis: Difficulty in walking, not elsewhere classified (R26.2);Muscle weakness (generalized) (M62.81);Pain Pain - Right/Left: Right Pain - part of body: Ankle and joints of foot     Time: 1027-1050 PT Time Calculation (min) (ACUTE ONLY): 23 min  Charges:  $Gait Training: 23-37 mins                     Stephanie Ray, PT, DPT [phone]    Ray,Stephanie 03/20/2019, 11:34 AM   

## 2019-03-20 NOTE — Discharge Instructions (Signed)
Podiatry discharge instructions: 1.  Change dressings daily consisting of removal of iodoform packing gauze.  Place 1/2 inch Iodosorb packing gauze into the open wound followed by application of Xeroform to the incision site, 4 x 4 gauze, Kerlix, Ace wrap. 2.  Continue partial weightbearing with heel contact only to the right foot and the surgical heel wedge shoe.  Try to stay off of the right foot as much as possible 3.  Take antibiotics and pain medication as prescribed if prescribed. 4.  Follow-up with Dr. Vickki Muff in 1 week after discharge at [facility] clinic.   Osteomielitis en los adultos Osteomyelitis, Adult  Las infecciones seas (osteomielitis) se producen cuando en un hueso penetran bacterias u otros grmenes. Esto puede ocurrir si tiene una infeccin en otra parte del cuerpo que se propaga a travs de Herbalist. Los grmenes que hay en la piel o que se encuentran fuera del cuerpo tambin pueden causar este tipo de infeccin, si tiene una herida o un hueso quebrado (fractura) que le desgarra la piel. Las infecciones seas deben tratarse rpidamente para evitar daos en los huesos y para impedir que la infeccin se propague a otras zonas del cuerpo. Cules son las causas? [place] son causadas por bacterias. Otros grmenes tambin pueden producirlas, por ejemplo, los virus y los hongos. Qu incrementa el riesgo? Es ms probable que tenga esta afeccin si:  Recientemente se someti a [place], especialmente de los huesos o de las articulaciones.  Tiene una enfermedad a largo plazo (crnica), como las siguientes: ? Diabetes. ? VIH (virus de inmunodeficiencia humana). ? Artritis reumatoide. ? Anemia drepanoctica. ? Enfermedad renal que requiere dilisis.  Tiene 60 aos o ms.  Tiene una afeccin o toma medicamentos que bloquean o debilitan el sistema de defensa del cuerpo (sistema inmunitario).  Tiene una afeccin que reduce el flujo de  [place].  Tiene una articulacin artificial.  Se someti a una reparacin sea o articular con placas o tornillos (dispositivos quirrgicos).  Consume drogas por va intravenosa.  Tiene una va central para acceso intravenoso (i.v.).  Ha tenido un traumatismo, por ejemplo, pis un clavo o un hueso roto que [employer]. Cules son los signos o los sntomas? Los sntomas varan segn el tipo y la ubicacin de la infeccin. Los sntomas frecuentes de las infecciones seas incluyen los siguientes:  Cristy Hilts y [place].  Enrojecimiento y calor en la piel.  Hinchazn.  Dolor y rigidez.  Secrecin de lquido o pus cerca de la infeccin. Cmo se diagnostica? Esta afeccin se puede diagnosticar en funcin de lo siguiente:  Los sntomas y antecedentes mdicos.  Un examen fsico.  Estudios, como por ejemplo: ? La extraccin de Truddie Coco de tejido, lquido o sangre para examinarla con un microscopio. ? El hisopado de pus o secrecin de una herida para su anlisis, con el fin de identificar los grmenes y Teacher, adult education qu tipo de [employer] eliminar (cultivo y [place]). ? Anlisis de [place].  Estudios de diagnstico por imgenes. Estos pueden incluir lo siguiente: ? Radiografas. ? Resonancia magntica (RM). ? Exploracin por tomografa computarizada (TC). ? [place] sea. ? Ecografa. Cmo se trata? El tratamiento de esta afeccin depende de la causa y el tipo de la infeccin. Generalmente, los antibiticos son Software engineer tratamiento para una infeccin sea. Este puede realizarse en un hospital al principio. Luego, es posible que contine recibiendo antibiticos intravenosos en su casa o que los tome por va oral durante varias semanas. Otros tratamientos pueden  incluir ciruga para extirpar:  El tejido necrosado o a punto de necrosarse de un hueso.  Una articulacin artificial infectada.  Las placas o los tornillos infectados que se usaron para reparar un  hueso fracturado. Siga estas indicaciones en su casa: Medicamentos   [employer] de venta libre y los recetados solamente como se lo haya indicado el mdico.  Tome los antibiticos como se lo haya indicado el mdico. No deje de tomar el antibitico aunque comience a sentirse mejor.  Siga las indicaciones del mdico acerca de cmo recibir los antibiticos intravenosos en su casa. Tal vez deba pedirle a un enfermero que vaya a su casa para que le administre los antibiticos intravenosos. Indicaciones generales   Pregntele al mdico si tiene alguna restriccin en sus actividades.  Si se lo indican, aplique hielo sobre la zona afectada: ? Ponga el hielo en una bolsa plstica. ? Coloque una [employer] piel y la bolsa de hielo. ? Coloque el hielo durante , 2 a 3veces por da.  Lvese las manos frecuentemente con agua y [place]. Use desinfectante para manos si no dispone de [place] y [place].  No consuma ningn producto que contenga nicotina o tabaco, como cigarrillos y Administrator, Civil Service. Estos pueden retrasar la consolidacin del [place]. Si necesita ayuda para dejar de fumar, consulte al mdico.  Concurra a todas las visitas de seguimiento como se lo haya indicado el mdico. Esto es importante. Comunquese con un mdico si:  Tiene escalofros o fiebre.  Tiene enrojecimiento, sensacin de calor, dolor o hinchazn que regresan despus del tratamiento. Solicite ayuda de inmediato si:  Su respiracin es rpida o tiene dificultad para Industrial/product designer.  Siente dolor en el pecho.  No puede beber lquidos ni producir orina.  La zona afectada se hincha, cambia de color o se torna de color azul.  Siente adormecimiento o dolor intenso en la zona afectada. Resumen  Las infecciones seas (osteomielitis) se producen cuando en un hueso penetran bacterias u otros grmenes.  Su probabilidad de contraer este tipo de infeccin puede aumentar si tiene una afeccin, como la  diabetes, que reduce su capacidad de combatir las infecciones o aumenta sus probabilidades de Primary school teacher una infeccin.  La mayora de las infecciones seas son causadas por bacterias. Otros grmenes tambin pueden producirlas, por ejemplo, los virus y los hongos.  Generalmente, el tratamiento de esta afeccin comienza con antibiticos. La continuacin del tratamiento depende de la causa y el tipo de infeccin. Esta informacin no tiene Theme park manager el consejo del mdico. Asegrese de hacerle al mdico cualquier pregunta que tenga. Document Released: 08/20/2008 Document Revised: 07/25/2017 Document Reviewed: 07/25/2017 Elsevier Patient Education  2020 [employer].

## 2019-03-20 NOTE — TOC Progression Note (Signed)
Transition of Care [facility]) - Progression Note    Patient Details  Name: Loghan Kurtzman MRN: [id number] Date of Birth: [birth year]  Transition of Care [facility]) CM/SW Contact  Shelbie Hutching, RN Phone Number: 03/20/2019, 3:49 PM  Clinical Narrative:    Discharge information about home health reviewed with patient with the assistance of the Spanish interpreter.  Prescription for Augmentin printed and given to patient.  Patient will take the prescription and get it filled tonight and start taking tonight.   Gilford Rile has been delivered to the patient's room.     Expected Discharge Plan: [place] Barriers to Discharge: Barriers Resolved  Expected Discharge Plan and Services Expected Discharge Plan: [place]   Discharge Planning Services: CM Consult Post Acute Care Choice: [place] arrangements for the past 2 months: Single Family Home Expected Discharge Date: 03/20/19                         HH Arranged: RN, PT [facility] Agency: [place] Date [facility] Agency Contacted: 03/20/19 Time [place]: 1133 Representative spoke with at [place]: Beryle Beams   Social Determinants of Health (SDOH) Interventions    Readmission Risk Interventions No flowsheet data found.

## 2019-03-20 NOTE — TOC Progression Note (Signed)
Transition of Care [facility]) - Progression Note    Patient Details  Name: [name] MRN: [id number] Date of Birth: 02/14/1971  Transition of Care [facility]) CM/SW Contact  Shelbie Hutching, RN Phone Number: 03/20/2019, 11:34 AM  Clinical Narrative:    Alvis Lemmings has accepted referral for home health services.     Expected Discharge Plan: [place] Barriers to Discharge: Continued Medical Work up  Expected Discharge Plan and Services Expected Discharge Plan: [place]   Discharge Planning Services: CM Consult Post Acute Care Choice: [place] arrangements for the past 2 months: [place]: RN, PT [facility] Agency: [place] Date [place]: 03/20/19 Time [place]: 1133 Representative spoke with at [place]: [place] (SDOH) Interventions    Readmission Risk Interventions No flowsheet data found.

## 2019-03-21 LAB — CULTURE, BLOOD (ROUTINE X 2)
Culture: NO GROWTH
Culture: NO GROWTH
Special Requests: ADEQUATE
Special Requests: ADEQUATE

## 2019-03-23 LAB — AEROBIC/ANAEROBIC CULTURE W GRAM STAIN (SURGICAL/DEEP WOUND): Gram Stain: NONE SEEN

## 2019-08-25 ENCOUNTER — Ambulatory Visit: Payer: BC Managed Care – PPO

## 2019-09-07 ENCOUNTER — Ambulatory Visit: Payer: BC Managed Care – PPO | Attending: Internal Medicine

## 2019-09-07 DIAGNOSIS — Z23 Encounter for immunization: Secondary | ICD-10-CM

## 2019-09-07 NOTE — Progress Notes (Signed)
   Covid-19 Vaccination Clinic  Name:  Adrian Taylor    MRN: [id number] DOB: [birth year]  09/07/2019  Mr. [name] was observed post Covid-19 immunization for 15 minutes without incident. He was provided with Vaccine Information Sheet and instruction to access the V-Safe system. Medical Interpreter used.  Mr. [name] was instructed to call 911 with any severe reactions post vaccine: Marland Kitchen Difficulty breathing  . Swelling of face and throat  . A fast heartbeat  . A bad rash all over body  . Dizziness and weakness   Immunizations Administered    Name Date Dose VIS Date Route   Pfizer COVID-19 Vaccine 09/07/2019  4:09 PM 0.3 mL 05/18/2019 Intramuscular   Manufacturer: [employer], [employer]   Lot: [id number]   NDC: [address]-6

## 2019-09-29 ENCOUNTER — Ambulatory Visit: Payer: BC Managed Care – PPO | Attending: Internal Medicine

## 2019-09-29 DIAGNOSIS — Z23 Encounter for immunization: Secondary | ICD-10-CM

## 2019-09-29 NOTE — Progress Notes (Signed)
   Covid-19 Vaccination Clinic  Name:  Adrian Taylor    MRN: [id number] DOB: 12-Dec-1970  09/29/2019  [name] was observed post Covid-19 immunization for 15 minutes without incident. He was provided with Vaccine Information Sheet and instruction to access the V-Safe system.   [name] was instructed to call 911 with any severe reactions post vaccine: Marland Kitchen Difficulty breathing  . Swelling of face and throat  . A fast heartbeat  . A bad rash all over body  . Dizziness and weakness   Immunizations Administered    Name Date Dose VIS Date Route   Pfizer COVID-19 Vaccine 09/29/2019  3:37 PM 0.3 mL 08/01/2018 Intramuscular   Manufacturer: [employer], [employer]   Lot: [id number]   NDC: [address]-2

## 2020-11-12 DIAGNOSIS — Z20822 Contact with and (suspected) exposure to covid-19: Secondary | ICD-10-CM | POA: Diagnosis present

## 2020-11-12 DIAGNOSIS — Z79899 Other long term (current) drug therapy: Secondary | ICD-10-CM

## 2020-11-12 DIAGNOSIS — K4090 Unilateral inguinal hernia, without obstruction or gangrene, not specified as recurrent: Secondary | ICD-10-CM | POA: Diagnosis present

## 2020-11-12 DIAGNOSIS — E1165 Type 2 diabetes mellitus with hyperglycemia: Principal | ICD-10-CM | POA: Diagnosis present

## 2020-11-12 DIAGNOSIS — J069 Acute upper respiratory infection, unspecified: Secondary | ICD-10-CM | POA: Diagnosis present

## 2020-11-12 DIAGNOSIS — Z7984 Long term (current) use of oral hypoglycemic drugs: Secondary | ICD-10-CM

## 2020-11-12 DIAGNOSIS — T380X5A Adverse effect of glucocorticoids and synthetic analogues, initial encounter: Secondary | ICD-10-CM | POA: Diagnosis present

## 2020-11-12 DIAGNOSIS — Z8673 Personal history of transient ischemic attack (TIA), and cerebral infarction without residual deficits: Secondary | ICD-10-CM

## 2020-11-12 DIAGNOSIS — R911 Solitary pulmonary nodule: Secondary | ICD-10-CM | POA: Diagnosis present

## 2020-11-12 DIAGNOSIS — J329 Chronic sinusitis, unspecified: Secondary | ICD-10-CM | POA: Diagnosis present

## 2020-11-12 DIAGNOSIS — Z833 Family history of diabetes mellitus: Secondary | ICD-10-CM

## 2020-11-12 DIAGNOSIS — Z87891 Personal history of nicotine dependence: Secondary | ICD-10-CM

## 2020-11-12 DIAGNOSIS — Q5310 Unspecified undescended testicle, unilateral: Secondary | ICD-10-CM

## 2020-11-12 DIAGNOSIS — Z794 Long term (current) use of insulin: Secondary | ICD-10-CM

## 2020-11-12 DIAGNOSIS — R338 Other retention of urine: Secondary | ICD-10-CM | POA: Diagnosis present

## 2020-11-12 DIAGNOSIS — N401 Enlarged prostate with lower urinary tract symptoms: Secondary | ICD-10-CM | POA: Diagnosis present

## 2020-11-12 DIAGNOSIS — I10 Essential (primary) hypertension: Secondary | ICD-10-CM | POA: Diagnosis present

## 2020-11-12 DIAGNOSIS — J40 Bronchitis, not specified as acute or chronic: Secondary | ICD-10-CM | POA: Diagnosis present

## 2020-11-12 DIAGNOSIS — E871 Hypo-osmolality and hyponatremia: Secondary | ICD-10-CM | POA: Diagnosis present

## 2020-11-12 DIAGNOSIS — D72819 Decreased white blood cell count, unspecified: Secondary | ICD-10-CM | POA: Diagnosis present

## 2020-11-12 NOTE — ED Triage Notes (Addendum)
Triage assessment per Spanish interpreter: Lanora Manis [phone]  Pt arrived via POV with reports of elevated blood sugar x 1 day, pt states his last sugar reading was 517, states he used 22 units of fast acting insulin this evening.  Pt states he is currently on antibiotics for an infection in blood.  Pt is also currently on prednisone per MAR.  PT c/o feeling like he is going to pass out, pt appears lethargic in triage. Pt is able to answer questions appropriately.   Pt was hospitalized in April for CVA.

## 2020-11-13 ENCOUNTER — Emergency Department: Payer: BC Managed Care – PPO

## 2020-11-13 ENCOUNTER — Encounter: Payer: Self-pay | Admitting: Internal Medicine

## 2020-11-13 ENCOUNTER — Other Ambulatory Visit: Payer: Self-pay

## 2020-11-13 ENCOUNTER — Inpatient Hospital Stay
Admission: EM | Admit: 2020-11-13 | Discharge: 2020-11-15 | DRG: 638 | Disposition: A | Discharge status: Home / Self Care | Payer: BC Managed Care – PPO | Attending: Internal Medicine | Admitting: Internal Medicine

## 2020-11-13 DIAGNOSIS — Z7984 Long term (current) use of oral hypoglycemic drugs: Secondary | ICD-10-CM | POA: Diagnosis not present

## 2020-11-13 DIAGNOSIS — E1165 Type 2 diabetes mellitus with hyperglycemia: Secondary | ICD-10-CM

## 2020-11-13 DIAGNOSIS — E871 Hypo-osmolality and hyponatremia: Secondary | ICD-10-CM

## 2020-11-13 DIAGNOSIS — Q5310 Unspecified undescended testicle, unilateral: Secondary | ICD-10-CM | POA: Diagnosis not present

## 2020-11-13 DIAGNOSIS — A419 Sepsis, unspecified organism: Secondary | ICD-10-CM

## 2020-11-13 DIAGNOSIS — Z833 Family history of diabetes mellitus: Secondary | ICD-10-CM | POA: Diagnosis not present

## 2020-11-13 DIAGNOSIS — Z794 Long term (current) use of insulin: Secondary | ICD-10-CM

## 2020-11-13 DIAGNOSIS — R911 Solitary pulmonary nodule: Secondary | ICD-10-CM | POA: Diagnosis present

## 2020-11-13 DIAGNOSIS — Z20822 Contact with and (suspected) exposure to covid-19: Secondary | ICD-10-CM | POA: Diagnosis present

## 2020-11-13 DIAGNOSIS — T380X5A Adverse effect of glucocorticoids and synthetic analogues, initial encounter: Secondary | ICD-10-CM | POA: Diagnosis present

## 2020-11-13 DIAGNOSIS — Z87891 Personal history of nicotine dependence: Secondary | ICD-10-CM | POA: Diagnosis not present

## 2020-11-13 DIAGNOSIS — R339 Retention of urine, unspecified: Secondary | ICD-10-CM | POA: Diagnosis not present

## 2020-11-13 DIAGNOSIS — D72819 Decreased white blood cell count, unspecified: Secondary | ICD-10-CM

## 2020-11-13 DIAGNOSIS — Z79899 Other long term (current) drug therapy: Secondary | ICD-10-CM | POA: Diagnosis not present

## 2020-11-13 DIAGNOSIS — J40 Bronchitis, not specified as acute or chronic: Secondary | ICD-10-CM | POA: Diagnosis present

## 2020-11-13 DIAGNOSIS — R338 Other retention of urine: Secondary | ICD-10-CM

## 2020-11-13 DIAGNOSIS — K4090 Unilateral inguinal hernia, without obstruction or gangrene, not specified as recurrent: Secondary | ICD-10-CM | POA: Diagnosis present

## 2020-11-13 DIAGNOSIS — Z8673 Personal history of transient ischemic attack (TIA), and cerebral infarction without residual deficits: Secondary | ICD-10-CM | POA: Diagnosis not present

## 2020-11-13 DIAGNOSIS — J329 Chronic sinusitis, unspecified: Secondary | ICD-10-CM | POA: Diagnosis present

## 2020-11-13 DIAGNOSIS — J069 Acute upper respiratory infection, unspecified: Secondary | ICD-10-CM | POA: Diagnosis present

## 2020-11-13 DIAGNOSIS — N401 Enlarged prostate with lower urinary tract symptoms: Secondary | ICD-10-CM | POA: Diagnosis present

## 2020-11-13 DIAGNOSIS — I10 Essential (primary) hypertension: Secondary | ICD-10-CM | POA: Diagnosis present

## 2020-11-13 LAB — CBC WITH DIFFERENTIAL/PLATELET
Abs Immature Granulocytes: 0 10*3/uL (ref 0.00–0.07)
Basophils Absolute: 0 10*3/uL (ref 0.0–0.1)
Basophils Relative: 1 %
Eosinophils Absolute: 0 10*3/uL (ref 0.0–0.5)
Eosinophils Relative: 1 %
HCT: 33.2 % — ABNORMAL LOW (ref 39.0–52.0)
Hemoglobin: 11.2 g/dL — ABNORMAL LOW (ref 13.0–17.0)
Immature Granulocytes: 0 %
Lymphocytes Relative: 27 %
Lymphs Abs: 0.5 10*3/uL — ABNORMAL LOW (ref 0.7–4.0)
MCH: 27.2 pg (ref 26.0–34.0)
MCHC: 33.7 g/dL (ref 30.0–36.0)
MCV: 80.6 fL (ref 80.0–100.0)
Monocytes Absolute: 0.1 10*3/uL (ref 0.1–1.0)
Monocytes Relative: 4 %
Neutro Abs: 1.3 10*3/uL — ABNORMAL LOW (ref 1.7–7.7)
Neutrophils Relative %: 67 %
Platelets: 162 10*3/uL (ref 150–400)
RBC: 4.12 MIL/uL — ABNORMAL LOW (ref 4.22–5.81)
RDW: 12.8 % (ref 11.5–15.5)
WBC: 2 10*3/uL — ABNORMAL LOW (ref 4.0–10.5)
nRBC: 1 % — ABNORMAL HIGH (ref 0.0–0.2)

## 2020-11-13 LAB — BASIC METABOLIC PANEL
Anion gap: 8 (ref 5–15)
BUN: 27 mg/dL — ABNORMAL HIGH (ref 6–20)
CO2: 19 mmol/L — ABNORMAL LOW (ref 22–32)
Calcium: 8.5 mg/dL — ABNORMAL LOW (ref 8.9–10.3)
Chloride: 98 mmol/L (ref 98–111)
Creatinine, Ser: 1.17 mg/dL (ref 0.61–1.24)
GFR, Estimated: >60 mL/min (ref >60)
Glucose, Bld: 432 mg/dL — ABNORMAL HIGH (ref 70–99)
Potassium: 5.1 mmol/L (ref 3.5–5.1)
Sodium: 125 mmol/L — ABNORMAL LOW (ref 135–145)

## 2020-11-13 LAB — CBC
HCT: 33 % — ABNORMAL LOW (ref 39.0–52.0)
Hemoglobin: 11.2 g/dL — ABNORMAL LOW (ref 13.0–17.0)
MCH: 27.2 pg (ref 26.0–34.0)
MCHC: 33.9 g/dL (ref 30.0–36.0)
MCV: 80.1 fL (ref 80.0–100.0)
Platelets: 157 10*3/uL (ref 150–400)
RBC: 4.12 MIL/uL — ABNORMAL LOW (ref 4.22–5.81)
RDW: 12.6 % (ref 11.5–15.5)
WBC: 2 10*3/uL — ABNORMAL LOW (ref 4.0–10.5)
nRBC: 0 % (ref 0.0–0.2)

## 2020-11-13 LAB — GLUCOSE, CAPILLARY
Glucose-Capillary: 218 mg/dL — ABNORMAL HIGH (ref 70–99)
Glucose-Capillary: 208 mg/dL — ABNORMAL HIGH (ref 70–99)
Glucose-Capillary: 118 mg/dL — ABNORMAL HIGH (ref 70–99)
Glucose-Capillary: 215 mg/dL — ABNORMAL HIGH (ref 70–99)

## 2020-11-13 LAB — URINALYSIS, COMPLETE (UACMP) WITH MICROSCOPIC
Bilirubin Urine: NEGATIVE
Glucose, UA: >=500 mg/dL — AB
Ketones, ur: NEGATIVE mg/dL
Leukocytes,Ua: NEGATIVE
Nitrite: NEGATIVE
Protein, ur: 30 mg/dL — AB
Specific Gravity, Urine: 1.015 (ref 1.005–1.030)
WBC, UA: NONE SEEN WBC/hpf (ref 0–5)
pH: 5 (ref 5.0–8.0)

## 2020-11-13 LAB — CREATININE, SERUM
Creatinine, Ser: 0.83 mg/dL (ref 0.61–1.24)
GFR, Estimated: >60 mL/min (ref >60)

## 2020-11-13 LAB — MRSA PCR SCREENING: MRSA by PCR: NEGATIVE

## 2020-11-13 LAB — LACTIC ACID, PLASMA: Lactic Acid, Venous: 0.8 mmol/L (ref 0.5–1.9)

## 2020-11-13 LAB — CBG MONITORING, ED
Glucose-Capillary: 279 mg/dL — ABNORMAL HIGH (ref 70–99)
Glucose-Capillary: 369 mg/dL — ABNORMAL HIGH (ref 70–99)
Glucose-Capillary: 462 mg/dL — ABNORMAL HIGH (ref 70–99)

## 2020-11-13 LAB — HEMOGLOBIN A1C
Hgb A1c MFr Bld: 8.6 % — ABNORMAL HIGH (ref 4.8–5.6)
Mean Plasma Glucose: 200 mg/dL

## 2020-11-13 LAB — PROCALCITONIN: Procalcitonin: 1.18 ng/mL

## 2020-11-13 LAB — HIV ANTIBODY (ROUTINE TESTING W REFLEX): HIV Screen 4th Generation wRfx: NONREACTIVE

## 2020-11-13 MED ORDER — TAMSULOSIN HCL 0.4 MG PO CAPS
0.4000 mg | ORAL_CAPSULE | Freq: Every day | ORAL | Status: DC
  Administered 2020-11-13 – 2020-11-15 (×3): 0.4 mg via ORAL
  Filled 2020-11-13 (×3): qty 1

## 2020-11-13 MED ORDER — SODIUM CHLORIDE 0.9 % IV SOLN
2.0000 g | Freq: Three times a day (TID) | INTRAVENOUS | Status: DC
  Administered 2020-11-13 – 2020-11-15 (×6): 2 g via INTRAVENOUS
  Filled 2020-11-13 (×8): qty 2

## 2020-11-13 MED ORDER — INSULIN ASPART 100 UNIT/ML IJ SOLN
7.0000 [IU] | Freq: Once | INTRAMUSCULAR | Status: AC
  Administered 2020-11-13: 7 [IU] via INTRAVENOUS
  Filled 2020-11-13: qty 1

## 2020-11-13 MED ORDER — SODIUM CHLORIDE 0.9 % IV SOLN: INTRAVENOUS | Status: DC

## 2020-11-13 MED ORDER — ACETAMINOPHEN 650 MG RE SUPP: 650.0000 mg | Freq: Four times a day (QID) | RECTAL | Status: DC | PRN

## 2020-11-13 MED ORDER — ACETAMINOPHEN 500 MG PO TABS: 500.0000 mg | ORAL_TABLET | Freq: Four times a day (QID) | ORAL | Status: DC | PRN

## 2020-11-13 MED ORDER — SIMVASTATIN 20 MG PO TABS
40.0000 mg | ORAL_TABLET | Freq: Every day | ORAL | Status: DC
  Administered 2020-11-13 – 2020-11-14 (×2): 40 mg via ORAL
  Filled 2020-11-13 (×2): qty 2

## 2020-11-13 MED ORDER — LISINOPRIL 20 MG PO TABS
40.0000 mg | ORAL_TABLET | Freq: Every day | ORAL | Status: DC
  Administered 2020-11-13 – 2020-11-15 (×3): 40 mg via ORAL
  Filled 2020-11-13 (×3): qty 2

## 2020-11-13 MED ORDER — ACETAMINOPHEN 325 MG PO TABS: 650.0000 mg | ORAL_TABLET | Freq: Four times a day (QID) | ORAL | Status: DC | PRN

## 2020-11-13 MED ORDER — VANCOMYCIN HCL 1250 MG/250ML IV SOLN
1250.0000 mg | Freq: Once | INTRAVENOUS | Status: AC
  Administered 2020-11-13: 08:00:00 1250 mg via INTRAVENOUS
  Filled 2020-11-13 (×2): qty 250

## 2020-11-13 MED ORDER — INSULIN ASPART 100 UNIT/ML IJ SOLN
0.0000 [IU] | Freq: Three times a day (TID) | INTRAMUSCULAR | Status: DC
  Administered 2020-11-13 (×2): 5 [IU] via SUBCUTANEOUS
  Administered 2020-11-14: 3 [IU] via SUBCUTANEOUS
  Administered 2020-11-14 – 2020-11-15 (×2): 2 [IU] via SUBCUTANEOUS
  Filled 2020-11-13 (×5): qty 1

## 2020-11-13 MED ORDER — VITAMIN D (ERGOCALCIFEROL) 1.25 MG (50000 UNIT) PO CAPS
50000.0000 [IU] | ORAL_CAPSULE | ORAL | Status: DC
  Administered 2020-11-13: 17:00:00 50000 [IU] via ORAL
  Filled 2020-11-13 (×2): qty 1

## 2020-11-13 MED ORDER — ENOXAPARIN SODIUM 40 MG/0.4ML IJ SOSY
40.0000 mg | PREFILLED_SYRINGE | INTRAMUSCULAR | Status: DC
  Administered 2020-11-13 – 2020-11-15 (×3): 40 mg via SUBCUTANEOUS
  Filled 2020-11-13 (×3): qty 0.4

## 2020-11-13 MED ORDER — CHLORHEXIDINE GLUCONATE CLOTH 2 % EX PADS
6.0000 | MEDICATED_PAD | Freq: Every day | CUTANEOUS | Status: DC
  Administered 2020-11-14: 6 via TOPICAL

## 2020-11-13 MED ORDER — LACTATED RINGERS IV BOLUS (SEPSIS)
1000.0000 mL | Freq: Once | INTRAVENOUS | Status: AC
  Administered 2020-11-13: 1000 mL via INTRAVENOUS

## 2020-11-13 MED ORDER — INSULIN ASPART 100 UNIT/ML IJ SOLN
0.0000 [IU] | Freq: Every day | INTRAMUSCULAR | Status: DC
  Administered 2020-11-13: 2 [IU] via SUBCUTANEOUS
  Filled 2020-11-13: qty 1

## 2020-11-13 MED ORDER — IOHEXOL 300 MG/ML  SOLN
75.0000 mL | Freq: Once | INTRAMUSCULAR | Status: AC | PRN
  Administered 2020-11-13: 75 mL via INTRAVENOUS

## 2020-11-13 MED ORDER — VANCOMYCIN HCL IN DEXTROSE 1-5 GM/200ML-% IV SOLN
1000.0000 mg | INTRAVENOUS | Status: DC
  Filled 2020-11-13: qty 200

## 2020-11-13 MED ORDER — VANCOMYCIN HCL IN DEXTROSE 1-5 GM/200ML-% IV SOLN: 1000.0000 mg | Freq: Once | INTRAVENOUS | Status: DC

## 2020-11-13 MED ORDER — METRONIDAZOLE 500 MG/100ML IV SOLN
500.0000 mg | Freq: Three times a day (TID) | INTRAVENOUS | Status: DC
  Administered 2020-11-13: 08:00:00 500 mg via INTRAVENOUS
  Filled 2020-11-13 (×3): qty 100

## 2020-11-13 MED ORDER — TAMSULOSIN HCL 0.4 MG PO CAPS: 0.4000 mg | ORAL_CAPSULE | Freq: Every day | ORAL | Status: DC

## 2020-11-13 MED ORDER — ONDANSETRON HCL 4 MG/2ML IJ SOLN: 4.0000 mg | Freq: Four times a day (QID) | INTRAMUSCULAR | Status: DC | PRN

## 2020-11-13 MED ORDER — SODIUM CHLORIDE 0.9 % IV SOLN
1.0000 g | Freq: Once | INTRAVENOUS | Status: AC
  Administered 2020-11-13: 1 g via INTRAVENOUS
  Filled 2020-11-13: qty 1

## 2020-11-13 MED ORDER — SODIUM CHLORIDE 0.9 % IV SOLN
2.0000 g | Freq: Two times a day (BID) | INTRAVENOUS | Status: DC
  Filled 2020-11-13: qty 2

## 2020-11-13 MED ORDER — LACTATED RINGERS IV BOLUS
1000.0000 mL | Freq: Once | INTRAVENOUS | Status: AC
  Administered 2020-11-13: 1000 mL via INTRAVENOUS

## 2020-11-13 MED ORDER — ONDANSETRON HCL 4 MG PO TABS: 4.0000 mg | ORAL_TABLET | Freq: Four times a day (QID) | ORAL | Status: DC | PRN

## 2020-11-13 MED ORDER — PANTOPRAZOLE SODIUM 40 MG PO TBEC
40.0000 mg | DELAYED_RELEASE_TABLET | Freq: Every day | ORAL | Status: DC
  Administered 2020-11-13 – 2020-11-15 (×3): 40 mg via ORAL
  Filled 2020-11-13 (×3): qty 1

## 2020-11-13 NOTE — Progress Notes (Signed)
Inpatient Diabetes Program Recommendations  AACE/ADA: New Consensus Statement on Inpatient Glycemic Control (2015)  Target Ranges:  Prepandial:   less than 140 mg/dL      Peak postprandial:   less than 180 mg/dL (1-2 hours)      Critically ill patients:  140 - 180 mg/dL   Lab Results  Component Value Date   GLUCAP 279 (H) 11/13/2020   HGBA1C 14.6 (H) 03/16/2019    Review of Glycemic Control Results for Adrian Taylor, Adrian Taylor (MRN [id number]) as of 11/13/2020 07:25  Ref. Range 11/13/2020 00:00 11/13/2020 03:20 11/13/2020 04:15  Glucose-Capillary Latest Ref Range: 70 - 99 mg/dL 357 (H) 017 (H) 793 (H)   Diabetes history: DM2 Outpatient Diabetes medications: Lantus 22 units BID, Jardiance 10 mg QD, Metformin 500 mg BID Current orders for Inpatient glycemic control: Novolog 0-15 units TID and 0-5 units QHS  Inpatient Diabetes Program Recommendations:    Please consider,  Lantus 12 units BID  Will continue to follow while inpatient.  Thank you, Dulce Sellar, RN, BSN Diabetes Coordinator Inpatient Diabetes Program [phone] (team pager from 8a-5p)

## 2020-11-13 NOTE — ED Provider Notes (Addendum)
[facility] Emergency Department Provider Note  ____________________________________________  Time seen: Approximately 3:33 AM  I have reviewed the triage vital signs and the nursing notes.   HISTORY  Chief Complaint Hyperglycemia   HPI Adrian Taylor is a 50 y.o. 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  Patient reports 5 days of feeling unwell with body aches, chills, fatigue and fever.  He went to see his primary care doctor yesterday who put him on azithromycin, Tessalon Perles and prednisone.  Today his sugars have been elevated as high as 517.  Patient denies cough, congestion, headache, sore throat, vomiting or diarrhea, chest pain or shortness of breath, abdominal pain, dysuria or hematuria.  He was tested for COVID and flu yesterday which were both negative.  Past Medical History:  Diagnosis Date   Diabetes mellitus without complication (HCC)    Hypertension     Patient Active Problem List   Diagnosis Date Noted   Leukopenia 11/13/2020   Acute urinary retention 11/13/2020   Hyponatremia 11/13/2020   Hyperglycemia due to type 2 diabetes mellitus (HCC) 11/13/2020   History of CVA (cerebrovascular accident) 11/13/2020   Sepsis (HCC) 11/13/2020   Cellulitis 03/16/2019   Cauda equina compression (HCC) 08/03/2017    Past Surgical History:  Procedure Laterality Date   INCISION AND DRAINAGE Right 03/18/2019   Procedure: INCISION AND DRAINAGE;  Surgeon: Gwyneth Revels, DPM;  Location: [facility] ORS;  Service: Podiatry;  Laterality: Right;   LUMBAR LAMINECTOMY/DECOMPRESSION MICRODISCECTOMY N/A 08/03/2017   Procedure: LUMBAR LAMINECTOMY/DECOMPRESSION MICRODISCECTOMY L1-4;  Surgeon: Venetia Night, MD;  Location: [facility] ORS;  Service: Neurosurgery;  Laterality: N/A;    Prior to Admission medications   Medication Sig  Start Date End Date Taking? Authorizing Provider  acetaminophen (TYLENOL) 500 MG tablet Take 500 mg by mouth every 6 (six) hours as needed.    [provider]  empagliflozin (JARDIANCE) 10 MG TABS tablet Take 10 mg by mouth daily.    [provider]  enalapril (VASOTEC) 20 MG tablet Take 20 mg by mouth 2 (two) times daily. 03/13/19   [provider]  insulin glargine (LANTUS) 100 UNIT/ML injection Inject 22 Units into the skin 2 (two) times daily.     [provider]  metFORMIN (GLUCOPHAGE) 500 MG tablet Take 1,000 mg by mouth 2 (two) times daily with a meal.    [provider]  Saccharomyces boulardii (PROBIOTIC) 250 MG CAPS Take 1 capsule by mouth 2 (two) times daily. 03/20/19   Delfino Lovett, MD    Allergies Insulin aspart  No family history on file.  Social History Social History   Tobacco Use   Smoking status: Former    Pack years: 0.00    Types: Cigarettes    Quit date: 06/07/2002    Years since quitting: 18.4   Smokeless tobacco: Never   Tobacco comments:    Last tobacco use 15 years ago  Substance Use Topics   Alcohol use: Yes    Review of Systems  Constitutional: + fever, chills, body aches Eyes: Negative for visual changes. ENT: Negative for sore throat. Neck: No neck pain  Cardiovascular: Negative for chest pain. Respiratory: Negative for shortness of breath. Gastrointestinal: Negative for abdominal pain, vomiting or diarrhea. Genitourinary: Negative for dysuria. Musculoskeletal: Negative for back pain. Skin: Negative for rash. Neurological: Negative for headaches, weakness or numbness. Psych: No SI or HI  ____________________________________________  PHYSICAL EXAM:  VITAL SIGNS: ED Triage Vitals  Enc Vitals Group     BP 11/13/20 0004 120/89     Pulse Rate 11/13/20 0004 86     Resp 11/13/20 0004 18     Temp 11/13/20 0004 97.6 F (36.4 C)     Temp Source 11/13/20 0004 Oral     SpO2 11/13/20 0004 98 %      Weight 11/13/20 0003 116 lb (52.6 kg)     Height 11/13/20 0003 5' (1.524 m)     Head Circumference --      Peak Flow --      Pain Score 11/13/20 0004 0     Pain Loc --      Pain Edu? --      Excl. in GC? --     Constitutional: Alert and oriented. Well appearing and in no apparent distress. HEENT:      Head: Normocephalic and atraumatic.         Eyes: Conjunctivae are normal. Sclera is non-icteric.       Mouth/Throat: Mucous membranes are moist.       Neck: Supple with no signs of meningismus. Cardiovascular: Regular rate and rhythm. No murmurs, gallops, or rubs. 2+ symmetrical distal pulses are present in all extremities. No JVD. Respiratory: Normal respiratory effort. Lungs are clear to auscultation bilaterally.  Gastrointestinal: Soft, non tender, and non distended. Musculoskeletal:  No edema, cyanosis, or erythema of extremities. Neurologic: Normal speech and language. Face is symmetric. Moving all extremities. No gross focal neurologic deficits are appreciated. Skin: Skin is warm, dry and intact. No rash noted. Psychiatric: Mood and affect are normal. Speech and behavior are normal.  ____________________________________________   LABS (all labs ordered are listed, but only abnormal results are displayed)  Labs Reviewed  BASIC METABOLIC PANEL - Abnormal; Notable for the following components:      Result Value   Sodium 125 (*)    CO2 19 (*)    Glucose, Bld 432 (*)    BUN 27 (*)    Calcium 8.5 (*)    All other components within normal limits  CBC - Abnormal; Notable for the following components:   WBC 2.0 (*)    RBC 4.12 (*)    Hemoglobin 11.2 (*)    HCT 33.0 (*)    All other components within normal limits  URINALYSIS, COMPLETE (UACMP) WITH MICROSCOPIC - Abnormal; Notable for the following components:   Color, Urine STRAW (*)    APPearance CLEAR (*)    Glucose, UA >=500 (*)    Hgb urine dipstick SMALL (*)    Protein, ur 30 (*)    Bacteria, UA RARE (*)    All other  components within normal limits  CBC WITH DIFFERENTIAL/PLATELET - Abnormal; Notable for the following components:   WBC 2.0 (*)    RBC 4.12 (*)    Hemoglobin 11.2 (*)    HCT 33.2 (*)    nRBC 1.0 (*)    Neutro Abs 1.3 (*)    Lymphs Abs 0.5 (*)    All other components within normal limits  CBG MONITORING, ED - Abnormal; Notable for the following components:   Glucose-Capillary 462 (*)    All other components within normal limits  CBG MONITORING, ED - Abnormal; Notable for the following components:   Glucose-Capillary 369 (*)    All other components within normal limits  CBG MONITORING, ED - Abnormal; Notable for the following components:   Glucose-Capillary 279 (*)  All other components within normal limits  CULTURE, BLOOD (SINGLE)  URINE CULTURE  LACTIC ACID, PLASMA  PROCALCITONIN  DIFFERENTIAL  CBC WITH DIFFERENTIAL/PLATELET   ____________________________________________  EKG  ED ECG REPORT I, Nita Sicklearolina Deanie Jupiter, the attending physician, personally viewed and interpreted this ECG.  Sinus rhythm with rate of 83, normal intervals, normal axis, no ST elevations or depressions.  Normal EKG ____________________________________________  RADIOLOGY  I have personally reviewed the images performed during this visit and I agree with the Radiologist's read.   Interpretation by Radiologist:  CT ABDOMEN PELVIS W CONTRAST  Result Date: 11/13/2020 CLINICAL DATA:  Unspecified abdominal pain.  Elevated blood sugar EXAM: CT ABDOMEN AND PELVIS WITH CONTRAST TECHNIQUE: Multidetector CT imaging of the abdomen and pelvis was performed using the standard protocol following bolus administration of intravenous contrast. CONTRAST:  75mL OMNIPAQUE IOHEXOL 300 MG/ML  SOLN COMPARISON:  None. FINDINGS: Lower chest: Enlarged left lower lobe bronchial nodes measuring 12 mm in diameter. Left lower lobe nodule measuring 5 mm. Coronary atherosclerosis, unexpected for age. Hepatobiliary: No focal liver  abnormality.No evidence of biliary obstruction or stone. Pancreas: Unremarkable. Spleen: Unremarkable. Adrenals/Urinary Tract: Negative adrenals. No hydronephrosis or stone. Full urinary bladder, nearly reaching the umbilicus. Stomach/Bowel: Formed stool is seen throughout the colon. No evidence of bowel inflammation or obstruction. Vascular/Lymphatic: No acute vascular abnormality. Atheromatous wall thickening of the aorta and iliacs, age advanced. No mass or adenopathy. Reproductive:Nodular structure in the right lower quadrant contiguous with the gonadal vessel path, measuring 2.8 cm. Other: No ascites or pneumoperitoneum. Fatty right inguinal hernia containing reticulated fat and minimal ascitic fluid. Musculoskeletal: No acute abnormalities.  Multilevel laminectomy. IMPRESSION: 1. Right inguinal hernia containing reticulated/congested fat and ascitic fluid. 2. Left lower lobe bronchial adenopathy and small pulmonary nodule. Recommend full chest CT after convalescence. 3. Nodule along the lower right psoas contiguous with the gonadal vessels, suspicious for undescended testicle. Recommend urology referral if undescended testicle is confirmed on scrotal exam. 4. Diffuse stool retention. 5. Age advanced atherosclerosis which includes the coronary arteries. Electronically Signed   By: Marnee SpringJonathon  Watts M.D.   On: 11/13/2020 05:24   DG Chest Portable 1 View  Result Date: 11/13/2020 CLINICAL DATA:  Fever.  Elevated blood sugar. EXAM: PORTABLE CHEST 1 VIEW COMPARISON:  None. FINDINGS: The heart size and mediastinal contours are within normal limits. Low lung volumes. No focal consolidation. No pulmonary edema. No pleural effusion. No pneumothorax. No acute osseous abnormality. IMPRESSION: No active disease. Electronically Signed   By: Tish FredericksonMorgane  Naveau M.D.   On: 11/13/2020 04:22     ____________________________________________   PROCEDURES  Procedure(s) performed:yes .1-3 Lead EKG Interpretation  Date/Time:  11/13/2020 3:35 AM Performed by: Nita SickleVeronese, [place], MD Authorized by: Nita SickleVeronese, , MD     Interpretation: normal     ECG rate assessment: normal     Rhythm: sinus rhythm     Ectopy: none     Conduction: normal     Critical Care performed: yes  CRITICAL CARE Performed by: Nita Sicklearolina Meghan Warshawsky  ?  Total critical care time: 30 min  Critical care time was exclusive of separately billable procedures and treating other patients.  Critical care was necessary to treat or prevent imminent or life-threatening deterioration.  Critical care was time spent personally by me on the following activities: development of treatment plan with patient and/or surrogate as well as nursing, discussions with consultants, evaluation of patient's response to treatment, examination of patient, obtaining history from patient or surrogate, ordering and performing treatments and interventions,  ordering and review of laboratory studies, ordering and review of radiographic studies, pulse oximetry and re-evaluation of patient's condition.  ____________________________________________   INITIAL IMPRESSION / ASSESSMENT AND PLAN / ED COURSE  50 y.o. 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  I reviewed Dr.'s note from yesterday who says patient was complaining of cough and chest congestion.  I asked patient's several times and his son who was at bedside and patient adamantly denies having any type of congestion, headache, sore throat, cough, chest congestion, chest pain or shortness of breath.  His only symptoms are malaise, generalized weakness, fever, body aches and chills.  Therefore I did recommend stopping the prednisone, azithromycin and Tessalon Perles at this time.  We will get a chest x-ray to evaluate for any signs of  pneumonia.  COVID and flu was negative than yesterday.  We will get a urine to rule out UTI.  We will get a blood culture to rule out bacteremia.  Will check procalcitonin and lactic acid.  His labs show leukopenia with white count of 2 which patient has had in the past although not as low as 2.  His ANC is 1.3.  His urine has no signs of a urinary tract infection.  His blood work shows hyponatremia in the setting of hyperglycemia with a glucose of 432.  Will give IV fluids and insulin.  Patient placed on telemetry for close monitoring.  _________________________ 5:12 AM on 11/13/2020 ----------------------------------------- Lactic is normal procalcitonin is really elevated at 1.18.  Chest x-ray with no evidence of pneumonia.  CT of the abdomen and pelvis done to rule out malignancy which did not show any acute abnormalities other than left bronchial adenopathy.  Review of patient's doctor's notes from yesterday has a documented temp of 100F.  Patient reports having a fever before coming in but took Tylenol at home.  T-max was 100F at home as well.  With fever, leukopenia, elevated procalcitonin my concern is that patient could have sepsis.  We will start cefepime and Vanco and discussed with the hospitalist for admission.   _________________________ 5:46 AM on 11/13/2020 ----------------------------------------- CT showing a very distended bladder. Post void scan showing > 800cc. Patient reports that since having surgery for cauda equina by Dr. Myer Haff in 2019 that he felt that he is unable to fully empty his bladder at times. He also reports that this problem got worse after he had a hemorrhagic stroke a few months ago. He denies any back pain, lower extremity weakness or numbness or saddle anesthesia. Will place a foley catheter.     _____________________________________________ Please note:  Patient was evaluated in Emergency Department today for the symptoms described in the history of  present illness. Patient was evaluated in the context of the global COVID-19 pandemic, which necessitated consideration that the patient might be at risk for infection with the SARS-CoV-2 virus that causes COVID-19. Institutional protocols and algorithms that pertain to the evaluation of patients at risk for COVID-19 are in a state of rapid change based on information released by regulatory bodies including the CDC and federal and state organizations. These policies and algorithms were followed during the patient's care in the ED.  Some ED evaluations and interventions may be delayed as a result of limited staffing during the pandemic.   [place] Controlled Substance Database was reviewed by me. ____________________________________________   FINAL  CLINICAL IMPRESSION(S) / ED DIAGNOSES   Final diagnoses:  Sepsis without acute organ dysfunction, due to unspecified organism [facility])  Urinary retention      NEW MEDICATIONS STARTED DURING THIS VISIT:  ED Discharge Orders     None        Note:  This document was prepared using Dragon voice recognition software and may include unintentional dictation errors.    Don Perking, [place], MD 11/13/20 1610    Nita Sickle, MD 11/13/20 [phone]

## 2020-11-13 NOTE — Progress Notes (Signed)
Pharmacy Antibiotic Note  Adrian Taylor is a 50 y.o. male admitted on 11/13/2020 with Sepsis.  Pharmacy has been consulted for Cefepime, vancomycin dosing.  Plan: Cefepime 1 gm IV X 1 given in ED on 6/9 @ 0528. Additional cefepime 1 gm IV X 1 ordered to make total starting dose of cefepime 2 gm. Cefepime 2 gm IV Q12H ordered to continue on 6/9 @ 1700.  Vancomycin 1250 mg IV X 1 ordered for 6/9 @ 0700.  Vancomycin 1000 mg IV Q24H ordered to continue on 6/10 @ 0700.   AUC = 541.8  Vanc trough = 12.5   Height: 5' (152.4 cm) Weight: 52.6 kg (116 lb) IBW/kg (Calculated) : 50  Temp (24hrs), Avg:97.6 F (36.4 C), Min:97.6 F (36.4 C), Max:97.7 F (36.5 C)  Recent Labs  Lab 11/13/20 0011 11/13/20 0012  WBC 2.0* 2.0*  CREATININE  --  1.17  LATICACIDVEN  --  0.8    Estimated Creatinine Clearance: 53.4 mL/min (by C-G formula based on SCr of 1.17 mg/dL).    Allergies  Allergen Reactions   Insulin Aspart Itching    Pt states not allergic  Tolerates Lantus Pt states not allergic     Antimicrobials this admission:   >>    >>   Dose adjustments this admission:   Microbiology results:  BCx:   UCx:    Sputum:    MRSA PCR:   Thank you for allowing pharmacy to be a part of this patient's care.  Nancy Arvin D 11/13/2020 6:21 AM

## 2020-11-13 NOTE — ED Notes (Signed)
Bladder scan showed >878 mL urine.  Pt states he feels like he needs to urinate but does not feel like he can get it out right now.

## 2020-11-13 NOTE — H&P (Addendum)
History and Physical    Adrian Taylor JAS:[id number] DOB: 08/15/1970 DOA: 11/13/2020  PCP: [facility]   Patient coming from: Home  I have personally briefly reviewed patient's old medical records in [facility]  Chief Complaint: Hyperglycemia  HPI: Adrian Taylor is a 50 y.o. male with medical history significant for insulin-dependent diabetes mellitus, history of CVA, hypertension, cauda equina status post decompression in 2019 who presents to the ER for evaluation of persistent hyperglycemia. Patient reports 5 days of feeling unwell with body aches, chills, fever and fatigue.  He was seen at the urgent care clinic 1 day prior to his admission and was started on azithromycin, prednisone and Tessalon Perles.  States that his sugars have been high after he took the medication and have been in the 500s.  Patient tested negative for COVID 19 virus as well as influenza. He has a cough which is nonproductive but denies having any chest pain, no shortness of breath, no abdominal pain, no nausea, no vomiting, no urinary symptoms, no changes in his bowel habits, no dizziness, no lightheadedness, no focal deficits or blurred vision. Labs show sodium 125, potassium 5.1, chloride 98, bicarb 19, glucose 432, BUN 27, creatinine 1.17, calcium 8.5, lactic acid 0.8, white count 2.0, hemoglobin 11.2, hematocrit 33.0, MCV 80.1, RDW 12.6, platelet count 157, procalcitonin 1.18 Chest x-ray reviewed by me shows no acute cardiopulmonary disease CT scan of abdomen and pelvis shows right inguinal hernia containing reticulated/congested fat and ascitic fluid. Left lower lobe bronchial adenopathy and small pulmonary nodule. Recommend full chest CT after convalescence. Nodule along the lower right psoas contiguous with the gonadal vessels, suspicious for undescended testicle. Recommend urology referral if undescended testicle is confirmed on scrotal exam.Diffuse stool  retention. Age advanced atherosclerosis which includes the coronary arteries.     Twelve-lead EKG reviewed by me shows normal sinus rhythm.                                                       ED Course: Patient is a 50 year old Hispanic male who presents to the ER accompanied by his son for evaluation of hyperglycemia after he was started on systemic steroids and azithromycin for an upper respiratory tract infection. CT scan of the abdomen and pelvis showed a very distended bladder and postvoid scan yielded 800 cc of urine.  Foley catheter was inserted in the ER.  Patient reports having a poor urine stream but denies having any back pain, no lower extremity weakness or numbness.  Foley catheter was placed in the ER. Patient was admitted to the hospital due to concerns for possible sepsis as evidenced by neutropenia, low-grade fever and elevated procalcitonin level.  Review of Systems: As per HPI otherwise all other systems reviewed and negative.   Past Medical History:  Diagnosis Date   Diabetes mellitus without complication (HCC)    Hypertension     Past Surgical History:  Procedure Laterality Date   INCISION AND DRAINAGE Right 03/18/2019   Procedure: INCISION AND DRAINAGE;  Surgeon: Gwyneth Revels, DPM;  Location: [facility] ORS;  Service: Podiatry;  Laterality: Right;   LUMBAR LAMINECTOMY/DECOMPRESSION MICRODISCECTOMY N/A 08/03/2017   Procedure: LUMBAR LAMINECTOMY/DECOMPRESSION MICRODISCECTOMY L1-4;  Surgeon: Venetia Night, MD;  Location: [facility] ORS;  Service: Neurosurgery;  Laterality: N/A;     reports  that he quit smoking about 18 years ago. His smoking use included cigarettes. He has never used smokeless tobacco. He reports current alcohol use. No history on file for drug use.  Allergies  Allergen Reactions   Insulin Aspart Itching    Pt states not allergic  Tolerates Lantus Pt states not allergic     Family History  Problem Relation Age of Onset   Diabetes Mother        Prior to Admission medications   Medication Sig Start Date End Date Taking? Authorizing Provider  acetaminophen (TYLENOL) 500 MG tablet Take 500 mg by mouth every 6 (six) hours as needed.    [provider]  empagliflozin (JARDIANCE) 10 MG TABS tablet Take 10 mg by mouth daily.    [provider]  enalapril (VASOTEC) 20 MG tablet Take 20 mg by mouth 2 (two) times daily. 03/13/19   [provider]  insulin glargine (LANTUS) 100 UNIT/ML injection Inject 22 Units into the skin 2 (two) times daily.     [provider]  metFORMIN (GLUCOPHAGE) 500 MG tablet Take 1,000 mg by mouth 2 (two) times daily with a meal.    [provider]  Saccharomyces boulardii (PROBIOTIC) 250 MG CAPS Take 1 capsule by mouth 2 (two) times daily. 03/20/19   Delfino Lovett, MD    Physical Exam: Vitals:   11/13/20 0428 11/13/20 0609 11/13/20 0654 11/13/20 0731  BP: (!) 131/105 (!) 176/95 (!) 167/95 (!) 175/93  Pulse: 80 88 90 87  Resp: 15 17 18 20   Temp: 97.6 F (36.4 C) 97.7 F (36.5 C) 97.6 F (36.4 C) 97.8 F (36.6 C)  TempSrc: Oral Oral Oral Oral  SpO2: 99% 100% 100% 100%  Weight:      Height:         Vitals:   11/13/20 0428 11/13/20 0609 11/13/20 0654 11/13/20 0731  BP: (!) 131/105 (!) 176/95 (!) 167/95 (!) 175/93  Pulse: 80 88 90 87  Resp: 15 17 18 20   Temp: 97.6 F (36.4 C) 97.7 F (36.5 C) 97.6 F (36.4 C) 97.8 F (36.6 C)  TempSrc: Oral Oral Oral Oral  SpO2: 99% 100% 100% 100%  Weight:      Height:          Constitutional: Alert and oriented x 3 . Not in any apparent distress.  Thin and frail HEENT:      Head: Normocephalic and atraumatic.         Eyes: PERLA, EOMI, Conjunctivae pallor, sclera is non-icteric.       Mouth/Throat: Mucous membranes are moist.       Neck: Supple with no signs of meningismus. Cardiovascular: Regular rate and rhythm. No murmurs, gallops, or rubs. 2+ symmetrical distal pulses are present . No JVD. No LE  edema Respiratory: Respiratory effort normal .Lungs sounds clear bilaterally. No wheezes, crackles, or rhonchi.  Gastrointestinal: Soft, non tender, and non distended with positive bowel sounds.  Genitourinary: No CVA tenderness. Foley catheter in place Musculoskeletal: Nontender with normal range of motion in all extremities. No cyanosis, or erythema of extremities. Neurologic:  Face is symmetric. Moving all extremities. No gross focal neurologic deficits . Skin: Skin is warm, dry.  No rash or ulcers Psychiatric: Mood and affect are normal    Labs on Admission: I have personally reviewed following labs and imaging studies  CBC: Recent Labs  Lab 11/13/20 0011 11/13/20 0012  WBC 2.0* 2.0*  NEUTROABS 1.3*  --   HGB 11.2* 11.2*  HCT  33.2* 33.0*  MCV 80.6 80.1  PLT 162 157   Basic Metabolic Panel: Recent Labs  Lab 11/13/20 0012 11/13/20 0723  NA 125*  --   K 5.1  --   CL 98  --   CO2 19*  --   GLUCOSE 432*  --   BUN 27*  --   CREATININE 1.17 0.83  CALCIUM 8.5*  --    GFR: Estimated Creatinine Clearance: 75.3 mL/min (by C-G formula based on SCr of 0.83 mg/dL). Liver Function Tests: No results for input(s): AST, ALT, ALKPHOS, BILITOT, PROT, ALBUMIN in the last 168 hours. No results for input(s): LIPASE, AMYLASE in the last 168 hours. No results for input(s): AMMONIA in the last 168 hours. Coagulation Profile: No results for input(s): INR, PROTIME in the last 168 hours. Cardiac Enzymes: No results for input(s): CKTOTAL, CKMB, CKMBINDEX, TROPONINI in the last 168 hours. BNP (last 3 results) No results for input(s): PROBNP in the last 8760 hours. HbA1C: No results for input(s): HGBA1C in the last 72 hours. CBG: Recent Labs  Lab 11/13/20 0000 11/13/20 0320 11/13/20 0415 11/13/20 0732  GLUCAP 462* 369* 279* 218*   Lipid Profile: No results for input(s): CHOL, HDL, LDLCALC, TRIG, CHOLHDL, LDLDIRECT in the last 72 hours. Thyroid Function Tests: No results for  input(s): TSH, T4TOTAL, FREET4, T3FREE, THYROIDAB in the last 72 hours. Anemia Panel: No results for input(s): VITAMINB12, FOLATE, FERRITIN, TIBC, IRON, RETICCTPCT in the last 72 hours. Urine analysis:    Component Value Date/Time   COLORURINE STRAW (A) 11/13/2020 0011   APPEARANCEUR CLEAR (A) 11/13/2020 0011   LABSPEC 1.015 11/13/2020 0011   PHURINE 5.0 11/13/2020 0011   GLUCOSEU >=500 (A) 11/13/2020 0011   HGBUR SMALL (A) 11/13/2020 0011   BILIRUBINUR NEGATIVE 11/13/2020 0011   KETONESUR NEGATIVE 11/13/2020 0011   PROTEINUR 30 (A) 11/13/2020 0011   NITRITE NEGATIVE 11/13/2020 0011   LEUKOCYTESUR NEGATIVE 11/13/2020 0011    Radiological Exams on Admission: CT ABDOMEN PELVIS W CONTRAST  Result Date: 11/13/2020 CLINICAL DATA:  Unspecified abdominal pain.  Elevated blood sugar EXAM: CT ABDOMEN AND PELVIS WITH CONTRAST TECHNIQUE: Multidetector CT imaging of the abdomen and pelvis was performed using the standard protocol following bolus administration of intravenous contrast. CONTRAST:  75mL OMNIPAQUE IOHEXOL 300 MG/ML  SOLN COMPARISON:  None. FINDINGS: Lower chest: Enlarged left lower lobe bronchial nodes measuring 12 mm in diameter. Left lower lobe nodule measuring 5 mm. Coronary atherosclerosis, unexpected for age. Hepatobiliary: No focal liver abnormality.No evidence of biliary obstruction or stone. Pancreas: Unremarkable. Spleen: Unremarkable. Adrenals/Urinary Tract: Negative adrenals. No hydronephrosis or stone. Full urinary bladder, nearly reaching the umbilicus. Stomach/Bowel: Formed stool is seen throughout the colon. No evidence of bowel inflammation or obstruction. Vascular/Lymphatic: No acute vascular abnormality. Atheromatous wall thickening of the aorta and iliacs, age advanced. No mass or adenopathy. Reproductive:Nodular structure in the right lower quadrant contiguous with the gonadal vessel path, measuring 2.8 cm. Other: No ascites or pneumoperitoneum. Fatty right inguinal hernia  containing reticulated fat and minimal ascitic fluid. Musculoskeletal: No acute abnormalities.  Multilevel laminectomy. IMPRESSION: 1. Right inguinal hernia containing reticulated/congested fat and ascitic fluid. 2. Left lower lobe bronchial adenopathy and small pulmonary nodule. Recommend full chest CT after convalescence. 3. Nodule along the lower right psoas contiguous with the gonadal vessels, suspicious for undescended testicle. Recommend urology referral if undescended testicle is confirmed on scrotal exam. 4. Diffuse stool retention. 5. Age advanced atherosclerosis which includes the coronary arteries. Electronically Signed   By: Marja Kays  Watts M.D.   On: 11/13/2020 05:24   DG Chest Portable 1 View  Result Date: 11/13/2020 CLINICAL DATA:  Fever.  Elevated blood sugar. EXAM: PORTABLE CHEST 1 VIEW COMPARISON:  None. FINDINGS: The heart size and mediastinal contours are within normal limits. Low lung volumes. No focal consolidation. No pulmonary edema. No pleural effusion. No pneumothorax. No acute osseous abnormality. IMPRESSION: No active disease. Electronically Signed   By: Tish FredericksonMorgane  Naveau M.D.   On: 11/13/2020 04:22     Assessment/Plan Principal Problem:   Hyperglycemia due to type 2 diabetes mellitus (HCC) Active Problems:   Leukopenia   Acute urinary retention   Hyponatremia   History of CVA (cerebrovascular accident)   Sepsis (HCC)    Hyperglycemia due to type 2 diabetes mellitus Steroid-induced Will discontinue prednisone Maintain consistent carbohydrate diet Glycemic control with sliding scale insulin    Hyponatremia Secondary to hypoglycemia Expect improvement in serum sodium level following hydration and improved glycemic control    Acute urinary retention Unclear etiology ??.  BPH Patient had 800 cc of postvoid residual and states that he has a full urine stream and feeling complete voiding We will place patient on Flomax He did not need to be discharged to the  Foley catheter and follow-up with urology as an outpatient    Rule out sepsis Unclear source of infection at this time  Patient has leukopenia and had a low-grade fever with a T-max of 100 F, elevated procalcitonin level with normal lactic acid. Fever and leukopenia may be related to acute viral illness He received vancomycin and cefepime in the ER. We will hold off on further doses of antibiotic therapy for now until culture results become available  DVT prophylaxis: Lovenox  Code Status: full code  Family Communication:  Greater than 50% of time was spent discussing patient's condition and plan of care with patient and his son at the bedside.  All questions and concerns have been addressed.  They verbalized understanding and agree with the plan. Disposition Plan: Back to previous home environment Consults called: none  Status: At the time of admission, it appears that the admission status for this patient is inpatient. This is judged to be reasonable and necessary negative.  The required intensity of service to ensure the patient's safety given the presenting symptoms, physical exam findings, and initial radiographic and laboratory data in the context of their comorbid conditions. Patient requires inpatient status due to high intensity of service, high risk for further deterioration and high frequency of surveillance required.    Lucile Shuttersochukwu Clarivel Callaway MD [facility]     11/13/2020, 8:19 AM

## 2020-11-13 NOTE — TOC Progression Note (Signed)
Transition of Care [facility]) - Progression Note    Patient Details  Name: Adrian Taylor MRN: [id number] Date of Birth: 05/31/1971  [facility] CM/SW Contact  Caryn Section, RN Phone Number: 11/13/2020, 2:54 PM  Clinical Narrative:   RNCM in to see patient, family at bedside.  Patient lives at home with wife and children, who can assist him if needed after discharge.  Patient has no concerns about returning home after discharge.  Patient's son states that he has no home health services and does not use any DME.  He is seen at [facility] clinic.  There are no concerns with patient getting to appointments and getting medications.  Patient's son states that his father takes medications as directed.  Patient and family declined TOC needs at this time.  TOC contact  information given.    Expected Discharge Plan: Home/Self Care Barriers to Discharge: Continued Medical Work up  Expected Discharge Plan and Services Expected Discharge Plan: Home/Self Care   Discharge Planning Services: CM Consult Post Acute Care Choice: NA Living arrangements for the past 2 months: Single Family Home                                       Social Determinants of Health (SDOH) Interventions    Readmission Risk Interventions No flowsheet data found.

## 2020-11-14 DIAGNOSIS — R338 Other retention of urine: Secondary | ICD-10-CM

## 2020-11-14 DIAGNOSIS — Z794 Long term (current) use of insulin: Secondary | ICD-10-CM

## 2020-11-14 DIAGNOSIS — E871 Hypo-osmolality and hyponatremia: Secondary | ICD-10-CM

## 2020-11-14 DIAGNOSIS — Z8673 Personal history of transient ischemic attack (TIA), and cerebral infarction without residual deficits: Secondary | ICD-10-CM

## 2020-11-14 DIAGNOSIS — E1165 Type 2 diabetes mellitus with hyperglycemia: Principal | ICD-10-CM

## 2020-11-14 LAB — CBC WITH DIFFERENTIAL/PLATELET
Abs Immature Granulocytes: 0.01 10*3/uL (ref 0.00–0.07)
Basophils Absolute: 0 10*3/uL (ref 0.0–0.1)
Basophils Relative: 0 %
Eosinophils Absolute: 0.3 10*3/uL (ref 0.0–0.5)
Eosinophils Relative: 5 %
HCT: 30.3 % — ABNORMAL LOW (ref 39.0–52.0)
Hemoglobin: 10.3 g/dL — ABNORMAL LOW (ref 13.0–17.0)
Immature Granulocytes: 0 %
Lymphocytes Relative: 31 %
Lymphs Abs: 1.7 10*3/uL (ref 0.7–4.0)
MCH: 27.2 pg (ref 26.0–34.0)
MCHC: 34 g/dL (ref 30.0–36.0)
MCV: 80.2 fL (ref 80.0–100.0)
Monocytes Absolute: 0.5 10*3/uL (ref 0.1–1.0)
Monocytes Relative: 9 %
Neutro Abs: 2.9 10*3/uL (ref 1.7–7.7)
Neutrophils Relative %: 55 %
Platelets: 172 10*3/uL (ref 150–400)
RBC: 3.78 MIL/uL — ABNORMAL LOW (ref 4.22–5.81)
RDW: 12.9 % (ref 11.5–15.5)
WBC: 5.3 10*3/uL (ref 4.0–10.5)
nRBC: 0 % (ref 0.0–0.2)

## 2020-11-14 LAB — PROCALCITONIN: Procalcitonin: 0.53 ng/mL

## 2020-11-14 LAB — URINE CULTURE: Culture: NO GROWTH

## 2020-11-14 LAB — BASIC METABOLIC PANEL
Anion gap: 6 (ref 5–15)
BUN: 23 mg/dL — ABNORMAL HIGH (ref 6–20)
CO2: 21 mmol/L — ABNORMAL LOW (ref 22–32)
Calcium: 8.7 mg/dL — ABNORMAL LOW (ref 8.9–10.3)
Chloride: 108 mmol/L (ref 98–111)
Creatinine, Ser: 0.91 mg/dL (ref 0.61–1.24)
GFR, Estimated: >60 mL/min (ref >60)
Glucose, Bld: 145 mg/dL — ABNORMAL HIGH (ref 70–99)
Potassium: 4.2 mmol/L (ref 3.5–5.1)
Sodium: 135 mmol/L (ref 135–145)

## 2020-11-14 LAB — GLUCOSE, CAPILLARY
Glucose-Capillary: 146 mg/dL — ABNORMAL HIGH (ref 70–99)
Glucose-Capillary: 139 mg/dL — ABNORMAL HIGH (ref 70–99)
Glucose-Capillary: 117 mg/dL — ABNORMAL HIGH (ref 70–99)
Glucose-Capillary: 157 mg/dL — ABNORMAL HIGH (ref 70–99)
Glucose-Capillary: 154 mg/dL — ABNORMAL HIGH (ref 70–99)

## 2020-11-14 LAB — PROTIME-INR
INR: 1 (ref 0.8–1.2)
Prothrombin Time: 13.5 seconds (ref 11.4–15.2)

## 2020-11-14 LAB — CORTISOL-AM, BLOOD: Cortisol - AM: 7 ug/dL (ref 6.7–22.6)

## 2020-11-14 MED ORDER — ATORVASTATIN CALCIUM 20 MG PO TABS
20.0000 mg | ORAL_TABLET | Freq: Every day | ORAL | Status: DC
  Administered 2020-11-15: 20 mg via ORAL
  Filled 2020-11-14: qty 1

## 2020-11-14 MED ORDER — HYDRALAZINE HCL 50 MG PO TABS
50.0000 mg | ORAL_TABLET | Freq: Three times a day (TID) | ORAL | Status: DC | PRN
  Administered 2020-11-14: 50 mg via ORAL
  Filled 2020-11-14: qty 1

## 2020-11-14 MED ORDER — AMLODIPINE BESYLATE 5 MG PO TABS
5.0000 mg | ORAL_TABLET | Freq: Every day | ORAL | Status: DC
  Administered 2020-11-14 – 2020-11-15 (×2): 5 mg via ORAL
  Filled 2020-11-14 (×2): qty 1

## 2020-11-14 MED ORDER — INSULIN GLARGINE 100 UNIT/ML ~~LOC~~ SOLN
12.0000 [IU] | Freq: Every day | SUBCUTANEOUS | Status: DC
  Administered 2020-11-14: 12 [IU] via SUBCUTANEOUS
  Filled 2020-11-14 (×2): qty 0.12

## 2020-11-14 NOTE — Progress Notes (Addendum)
Progress Note    Adrian Taylor  YQM:[id number] DOB: 1971-05-20  DOA: 11/13/2020 PCP: [facility]      Brief Narrative:    Medical records reviewed and are as summarized below:  Adrian Taylor is a 50 y.o. male with past medical history significant for insulin-dependent diabetes mellitus, hypertension, history of stroke (intraparenchymal hemorrhage), cauda equina syndrome s/p decompression in 2019.  He was recently seen by his PCP on 11/13/2019 and was prescribed prednisone and azithromycin for bronchitis and sinusitis.  He presented to the hospital because of cough and high blood glucose levels after taking prednisone.  His glucose level was 462 on admission.  He was treated with IV fluids and insulin.  He was given empiric IV antibiotics for suspected infection.  However, no evidence of infection was discovered.  Foley catheter was also placed for acute urinary retention.  Bladder scan showed >878 mls of urine in the emergency room.    Assessment/Plan:   Principal Problem:   Hyperglycemia due to type 2 diabetes mellitus (HCC) Active Problems:   Leukopenia   Acute urinary retention   Hyponatremia   History of CVA (cerebrovascular accident)   Body mass index is 22.65 kg/m.   Type II DM with hyperglycemia: Glucose levels are better.  Prednisone has been discontinued.  Continue Lantus and NovoLog.  Globin A1c is 8.6 (improved from 14.6 about a year ago)  Hypertension: BP is uncontrolled.  Continue lisinopril.  Amlodipine has been added for adequate BP control.  Acute urinary retention: Patient prefers to have Foley removed before discharge.  Start voiding trial.  Continue Flomax.  Outpatient follow-up with urologist recommended.  Left undescended testis, right inguinal hernia: Outpatient follow-up with urologist as recommended.  Left lower lobe lung nodule: Outpatient follow-up with PCP.  Hyponatremia leukopenia:  resolved  Probable acute URI: Symptomatic management.  No evidence of sepsis at this time.  Urine culture did not show any growth.  Follow-up blood cultures.  Discontinue IV antibiotics.  History of stroke (intraparenchymal hemorrhage)   iPad Spanish interpretation service was used for this encounter.    Diet Order             Diet heart healthy/carb modified Room service appropriate? Yes; Fluid consistency: Thin  Diet effective now                      Consultants: None  Procedures: None    Medications:    amLODipine  5 mg Oral Daily   Chlorhexidine Gluconate Cloth  6 each Topical Daily   enoxaparin (LOVENOX) injection  40 mg Subcutaneous Q24H   insulin aspart  0-15 Units Subcutaneous TID WC   insulin aspart  0-5 Units Subcutaneous QHS   insulin glargine  12 Units Subcutaneous QHS   lisinopril  40 mg Oral Daily   pantoprazole  40 mg Oral Daily   simvastatin  40 mg Oral Daily   tamsulosin  0.4 mg Oral Daily   Vitamin D (Ergocalciferol)  50,000 Units Oral Q7 days   Continuous Infusions:  sodium chloride 100 mL/hr at 11/14/20 0731   ceFEPime (MAXIPIME) IV 2 g (11/14/20 1528)     Anti-infectives (From admission, onward)    Start     Dose/Rate Route Frequency Ordered Stop   11/14/20 0800  vancomycin (VANCOCIN) IVPB 1000 mg/200 mL premix  Status:  Discontinued        1,000 mg 200 mL/hr over 60 Minutes Intravenous  Every 24 hours 11/13/20 0905 11/14/20 0844   11/14/20 0700  vancomycin (VANCOCIN) IVPB 1000 mg/200 mL premix  Status:  Discontinued        1,000 mg 200 mL/hr over 60 Minutes Intravenous  Once 11/13/20 0619 11/13/20 0905   11/13/20 1700  ceFEPIme (MAXIPIME) 2 g in sodium chloride 0.9 % 100 mL IVPB  Status:  Discontinued        2 g 200 mL/hr over 30 Minutes Intravenous Every 12 hours 11/13/20 0615 11/13/20 1248   11/13/20 1400  ceFEPIme (MAXIPIME) 2 g in sodium chloride 0.9 % 100 mL IVPB        2 g 200 mL/hr over 30 Minutes Intravenous Every 8  hours 11/13/20 1248     11/13/20 0630  vancomycin (VANCOREADY) IVPB 1250 mg/250 mL        1,250 mg 166.7 mL/hr over 90 Minutes Intravenous  Once 11/13/20 0616 11/13/20 0945   11/13/20 0615  ceFEPIme (MAXIPIME) 1 g in sodium chloride 0.9 % 100 mL IVPB        1 g 200 mL/hr over 30 Minutes Intravenous  Once 11/13/20 0612 11/13/20 0742   11/13/20 0600  metroNIDAZOLE (FLAGYL) IVPB 500 mg  Status:  Discontinued        500 mg 100 mL/hr over 60 Minutes Intravenous Every 8 hours 11/13/20 0553 11/13/20 0829   11/13/20 0515  ceFEPIme (MAXIPIME) 1 g in sodium chloride 0.9 % 100 mL IVPB        1 g 200 mL/hr over 30 Minutes Intravenous  Once 11/13/20 0509 11/13/20 0609   11/13/20 0515  vancomycin (VANCOCIN) IVPB 1000 mg/200 mL premix  Status:  Discontinued        1,000 mg 200 mL/hr over 60 Minutes Intravenous  Once 11/13/20 0509 11/13/20 [id number]              Family Communication/Anticipated D/C date and plan/Code Status   DVT prophylaxis: enoxaparin (LOVENOX) injection 40 mg Start: 11/13/20 0800     Code Status: Full Code  Family Communication: None Disposition Plan:    Status is: Inpatient  Remains inpatient appropriate because:Inpatient level of care appropriate due to severity of illness  Dispo: The patient is from: Home              Anticipated d/c is to: Home              Patient currently is not medically stable to d/c.   Difficult to place patient No           Subjective:   Interval events noted.  No shortness of breath or chest pain.  Objective:    Vitals:   11/13/20 2354 11/14/20 0404 11/14/20 0801 11/14/20 1118  BP: 134/78 (!) 152/84 (!) 166/90 (!) 178/96  Pulse: 91 91 91 86  Resp: 16 18 16 15   Temp: 98 F (36.7 C) 98 F (36.7 C) 98.3 F (36.8 C) 97.9 F (36.6 C)  TempSrc: Oral Oral    SpO2: 100% 99% 99% 99%  Weight:      Height:       No data found.   Intake/Output Summary (Last 24 hours) at 11/14/2020 1614 Last data filed at 11/14/2020  1528 Gross per 24 hour  Intake 2384.55 ml  Output 4925 ml  Net -2540.45 ml   Filed Weights   11/13/20 0003  Weight: 52.6 kg    Exam:  GEN: NAD SKIN: No rash EYES: EOMI ENT: MMM CV: RRR PULM: CTA B ABD:  soft, ND, NT, +BS CNS: AAO x 3, non focal EXT: No edema or tenderness GU: No CVA tenderness.  No testis palpable in the left scrotum.  Foley catheter draining amber urine.      Data Reviewed:   I have personally reviewed following labs and imaging studies:  Labs: Labs show the following:   Basic Metabolic Panel: Recent Labs  Lab 11/13/20 0012 11/13/20 0723 11/14/20 1043  NA 125*  --  135  K 5.1  --  4.2  CL 98  --  108  CO2 19*  --  21*  GLUCOSE 432*  --  145*  BUN 27*  --  23*  CREATININE 1.17 0.83 0.91  CALCIUM 8.5*  --  8.7*   GFR Estimated Creatinine Clearance: 68.7 mL/min (by C-G formula based on SCr of 0.91 mg/dL). Liver Function Tests: No results for input(s): AST, ALT, ALKPHOS, BILITOT, PROT, ALBUMIN in the last 168 hours. No results for input(s): LIPASE, AMYLASE in the last 168 hours. No results for input(s): AMMONIA in the last 168 hours. Coagulation profile Recent Labs  Lab 11/14/20 0451  INR 1.0    CBC: Recent Labs  Lab 11/13/20 0011 11/13/20 0012 11/14/20 1043  WBC 2.0* 2.0* 5.3  NEUTROABS 1.3*  --  2.9  HGB 11.2* 11.2* 10.3*  HCT 33.2* 33.0* 30.3*  MCV 80.6 80.1 80.2  PLT 162 157 172   Cardiac Enzymes: No results for input(s): CKTOTAL, CKMB, CKMBINDEX, TROPONINI in the last 168 hours. BNP (last 3 results) No results for input(s): PROBNP in the last 8760 hours. CBG: Recent Labs  Lab 11/13/20 1629 11/13/20 2105 11/14/20 0401 11/14/20 0816 11/14/20 1203  GLUCAP 118* 215* 146* 139* 117*   D-Dimer: No results for input(s): DDIMER in the last 72 hours. Hgb A1c: Recent Labs    11/13/20 0723  HGBA1C 8.6*   Lipid Profile: No results for input(s): CHOL, HDL, LDLCALC, TRIG, CHOLHDL, LDLDIRECT in the last 72  hours. Thyroid function studies: No results for input(s): TSH, T4TOTAL, T3FREE, THYROIDAB in the last 72 hours.  Invalid input(s): FREET3 Anemia work up: No results for input(s): VITAMINB12, FOLATE, FERRITIN, TIBC, IRON, RETICCTPCT in the last 72 hours. Sepsis Labs: Recent Labs  Lab 11/13/20 0011 11/13/20 0012 11/14/20 0451 11/14/20 1043  PROCALCITON 1.18  --  0.53  --   WBC 2.0* 2.0*  --  5.3  LATICACIDVEN  --  0.8  --   --     Microbiology Recent Results (from the past 240 hour(s))  Urine culture     Status: None   Collection Time: 11/13/20 12:11 AM   Specimen: In/Out Cath Urine  Result Value Ref Range Status   Specimen Description   Final    IN/OUT CATH URINE Performed at [facility], [address] [place], [place] [address]    Special Requests   Final    NONE Performed at [facility], [address] [place], [place] [address]    Culture   Final    NO GROWTH Performed at [facility], [address] [place], [place] [address]    Report Status 11/14/2020 FINAL  Final  Blood culture (single)     Status: None (Preliminary result)   Collection Time: 11/13/20  3:31 AM   Specimen: BLOOD  Result Value Ref Range Status   Specimen Description BLOOD LEFT AC  Final   Special Requests   Final    BOTTLES DRAWN AEROBIC AND ANAEROBIC Blood Culture adequate volume  Culture   Final    NO GROWTH 1 DAY Performed at [facility], [address] [place], [place] [address]    Report Status PENDING  Incomplete  MRSA PCR Screening     Status: None   Collection Time: 11/13/20 11:27 AM   Specimen: Nasal Mucosa; Nasopharyngeal  Result Value Ref Range Status   MRSA by PCR NEGATIVE NEGATIVE Final    Comment:        The GeneXpert MRSA Assay (FDA approved for NASAL specimens only), is one component of a comprehensive MRSA colonization surveillance program. It is not intended to diagnose MRSA infection nor to guide or monitor treatment  for MRSA infections. Performed at [facility], [address], [place], [place] [address]     Procedures and diagnostic studies:  CT ABDOMEN PELVIS W CONTRAST  Result Date: 11/13/2020 CLINICAL DATA:  Unspecified abdominal pain.  Elevated blood sugar EXAM: CT ABDOMEN AND PELVIS WITH CONTRAST TECHNIQUE: Multidetector CT imaging of the abdomen and pelvis was performed using the standard protocol following bolus administration of intravenous contrast. CONTRAST:  41mL OMNIPAQUE IOHEXOL 300 MG/ML  SOLN COMPARISON:  None. FINDINGS: Lower chest: Enlarged left lower lobe bronchial nodes measuring 12 mm in diameter. Left lower lobe nodule measuring 5 mm. Coronary atherosclerosis, unexpected for age. Hepatobiliary: No focal liver abnormality.No evidence of biliary obstruction or stone. Pancreas: Unremarkable. Spleen: Unremarkable. Adrenals/Urinary Tract: Negative adrenals. No hydronephrosis or stone. Full urinary bladder, nearly reaching the umbilicus. Stomach/Bowel: Formed stool is seen throughout the colon. No evidence of bowel inflammation or obstruction. Vascular/Lymphatic: No acute vascular abnormality. Atheromatous wall thickening of the aorta and iliacs, age advanced. No mass or adenopathy. Reproductive:Nodular structure in the right lower quadrant contiguous with the gonadal vessel path, measuring 2.8 cm. Other: No ascites or pneumoperitoneum. Fatty right inguinal hernia containing reticulated fat and minimal ascitic fluid. Musculoskeletal: No acute abnormalities.  Multilevel laminectomy. IMPRESSION: 1. Right inguinal hernia containing reticulated/congested fat and ascitic fluid. 2. Left lower lobe bronchial adenopathy and small pulmonary nodule. Recommend full chest CT after convalescence. 3. Nodule along the lower right psoas contiguous with the gonadal vessels, suspicious for undescended testicle. Recommend urology referral if undescended testicle is confirmed on scrotal exam. 4. Diffuse stool  retention. 5. Age advanced atherosclerosis which includes the coronary arteries. Electronically Signed   By: Marnee Spring M.D.   On: 11/13/2020 05:24   DG Chest Portable 1 View  Result Date: 11/13/2020 CLINICAL DATA:  Fever.  Elevated blood sugar. EXAM: PORTABLE CHEST 1 VIEW COMPARISON:  None. FINDINGS: The heart size and mediastinal contours are within normal limits. Low lung volumes. No focal consolidation. No pulmonary edema. No pleural effusion. No pneumothorax. No acute osseous abnormality. IMPRESSION: No active disease. Electronically Signed   By: Tish Frederickson M.D.   On: 11/13/2020 04:22               LOS: 1 day   Raidyn Breiner  [facility]   Pager on [email or web address]. If 7PM-7AM, please contact night-coverage at [email or web address]     11/14/2020, 4:14 PM

## 2020-11-14 NOTE — Progress Notes (Signed)
Patient with foley r/t acute urinary retention.  Foley removed per order and patient educated on reportable s/s.  Patient tolerated removal without difficulty.  Urinal in reach.  Will continue to monitor.

## 2020-11-14 NOTE — Progress Notes (Signed)
PHARMACIST - PHYSICIAN COMMUNICATION  CONCERNING:  Simvastatin dose with amlodipine   RECOMMENDATION: Patient was prescribed simvastatin 40 mg daily (home regimen)  Dose of simvastatin should not exceed 20 mg daily if coadministering with amlodipine due to increased risk of rhabdomyolysis   DESCRIPTION: Pharmacy has substituted equivalent dose of atorvastatin    Patient is now receiving atorvastatin 20 mg daily     Sharen Hones, PharmD, BCPS Clinical Pharmacist  11/14/2020 7:19 PM

## 2020-11-14 NOTE — Progress Notes (Signed)
Pharmacy Antibiotic Note  Adrian Taylor is a 50 y.o. male admitted on 11/13/2020 with Sepsis.  Pharmacy has been consulted for Cefepime, vancomycin dosing.  D2 Cefepime  Plan: Cefepime increased to 2g q8h for improved renal function (SCr: 1.17> 0.83>0.91) Monitor clinical picture and renal function F/U C&S, abx deescalation / LOT   Height: 5' (152.4 cm) Weight: 52.6 kg (116 lb) IBW/kg (Calculated) : 50  Temp (24hrs), Avg:98.1 F (36.7 C), Min:97.9 F (36.6 C), Max:98.4 F (36.9 C)  Recent Labs  Lab 11/13/20 0011 11/13/20 0012 11/13/20 0723 11/14/20 1043  WBC 2.0* 2.0*  --  5.3  CREATININE  --  1.17 0.83 0.91  LATICACIDVEN  --  0.8  --   --      Estimated Creatinine Clearance: 68.7 mL/min (by C-G formula based on SCr of 0.91 mg/dL).    Allergies  Allergen Reactions   Insulin Aspart Itching    Pt states not allergic  Tolerates Lantus Pt states not allergic     Antimicrobials this admission: Vancomycin 6/9 x1 Flagyl 6/9 x1  Cefepime 6/9  >>   Microbiology results:  BCx: [id number]  UCx: NG  MRSA PCR: negative  Thank you for allowing pharmacy to be a part of this patient's care.  Derrek Gu, PharmD 11/14/2020 3:32 PM

## 2020-11-15 LAB — CREATININE, SERUM
Creatinine, Ser: 0.87 mg/dL (ref 0.61–1.24)
GFR, Estimated: >60 mL/min (ref >60)

## 2020-11-15 LAB — GLUCOSE, CAPILLARY: Glucose-Capillary: 139 mg/dL — ABNORMAL HIGH (ref 70–99)

## 2020-11-15 MED ORDER — HYDROCHLOROTHIAZIDE 25 MG PO TABS: 12.5000 mg | ORAL_TABLET | Freq: Every day | ORAL | 0 refills | Status: AC

## 2020-11-15 NOTE — Discharge Summary (Addendum)
Physician Discharge Summary  Adrian Taylor [id number]:[id number] DOB: September 04, 1970 DOA: 11/13/2020  [facility]P: [facility]  Admit date: 11/13/2020 Discharge date: 11/15/2020  Discharge disposition: Home   Recommendations for Outpatient Follow-Up:   Follow up with PCP in 1 week. Flow up with [facility] urology in 2 weeks.   Discharge Diagnosis:   Principal Problem:   Hyperglycemia due to type 2 diabetes mellitus (HCC) Active Problems:   Leukopenia   Acute urinary retention   Hyponatremia   History of CVA (cerebrovascular accident)    Discharge Condition: Stable.  Diet recommendation:  Diet Order             Diet - low sodium heart healthy           Diet Carb Modified           Diet heart healthy/carb modified Room service appropriate? Yes; Fluid consistency: Thin  Diet effective now                     Code Status: Full Code     Hospital Course:   Adrian Taylor is a 50 y.o. male with past medical history significant for insulin-dependent diabetes mellitus, hypertension, history of stroke (intraparenchymal hemorrhage), cauda equina syndrome s/p decompression in 2019.  He was recently seen by his PCP on 11/13/2019 and was prescribed prednisone and azithromycin for bronchitis and sinusitis.  He presented to the hospital because of cough and high blood glucose levels after taking prednisone.  His glucose level was 462 on admission.   He was treated with IV fluids and insulin.  He was given empiric IV antibiotics for suspected infection.  However, no evidence of infection was discovered.  Foley catheter was also placed for acute urinary retention.  Bladder scan showed >878 mls of urine in the emergency room.  Foley catheter was removed and he was able to pass urine spontaneously.    Of note, patient has left undescended testis, right inguinal hernia and outpatient follow-up with urologist was strongly recommended.  Outpatient  follow-up with PCP was also recommended for monitoring of left lower lobe lung nodule.  His condition has improved and is deemed stable for discharge to home today.        Discharge Exam:    Vitals:   11/14/20 2049 11/15/20 0053 11/15/20 0509 11/15/20 0820  BP: (!) 183/96 (!) 143/79 (!) 143/86 (!) 161/87  Pulse: 91 97 90 86  Resp: 16 16 16 18   Temp: 98.6 F (37 C) 98.9 F (37.2 C) 97.6 F (36.4 C) 97.9 F (36.6 C)  TempSrc:  Oral Oral Oral  SpO2: 99% 99% 98% 100%  Weight:      Height:         GEN: NAD SKIN: Warm and dry EYES: EOMI ENT: MMM CV: RRR PULM: CTA B ABD: soft, ND, NT, +BS CNS: AAO x 3, non focal EXT: No edema or tenderness   The results of significant diagnostics from this hospitalization (including imaging, microbiology, ancillary and laboratory) are listed below for reference.     Procedures and Diagnostic Studies:   CT ABDOMEN PELVIS W CONTRAST  Result Date: 11/13/2020 CLINICAL DATA:  Unspecified abdominal pain.  Elevated blood sugar EXAM: CT ABDOMEN AND PELVIS WITH CONTRAST TECHNIQUE: Multidetector CT imaging of the abdomen and pelvis was performed using the standard protocol following bolus administration of intravenous contrast. CONTRAST:  41mL OMNIPAQUE IOHEXOL 300 MG/ML  SOLN COMPARISON:  None. FINDINGS: Lower chest:  Enlarged left lower lobe bronchial nodes measuring 12 mm in diameter. Left lower lobe nodule measuring 5 mm. Coronary atherosclerosis, unexpected for age. Hepatobiliary: No focal liver abnormality.No evidence of biliary obstruction or stone. Pancreas: Unremarkable. Spleen: Unremarkable. Adrenals/Urinary Tract: Negative adrenals. No hydronephrosis or stone. Full urinary bladder, nearly reaching the umbilicus. Stomach/Bowel: Formed stool is seen throughout the colon. No evidence of bowel inflammation or obstruction. Vascular/Lymphatic: No acute vascular abnormality. Atheromatous wall thickening of the aorta and iliacs, age advanced. No mass or  adenopathy. Reproductive:Nodular structure in the right lower quadrant contiguous with the gonadal vessel path, measuring 2.8 cm. Other: No ascites or pneumoperitoneum. Fatty right inguinal hernia containing reticulated fat and minimal ascitic fluid. Musculoskeletal: No acute abnormalities.  Multilevel laminectomy. IMPRESSION: 1. Right inguinal hernia containing reticulated/congested fat and ascitic fluid. 2. Left lower lobe bronchial adenopathy and small pulmonary nodule. Recommend full chest CT after convalescence. 3. Nodule along the lower right psoas contiguous with the gonadal vessels, suspicious for undescended testicle. Recommend urology referral if undescended testicle is confirmed on scrotal exam. 4. Diffuse stool retention. 5. Age advanced atherosclerosis which includes the coronary arteries. Electronically Signed   By: Marnee Spring M.D.   On: 11/13/2020 05:24   DG Chest Portable 1 View  Result Date: 11/13/2020 CLINICAL DATA:  Fever.  Elevated blood sugar. EXAM: PORTABLE CHEST 1 VIEW COMPARISON:  None. FINDINGS: The heart size and mediastinal contours are within normal limits. Low lung volumes. No focal consolidation. No pulmonary edema. No pleural effusion. No pneumothorax. No acute osseous abnormality. IMPRESSION: No active disease. Electronically Signed   By: Tish Frederickson M.D.   On: 11/13/2020 04:22     Labs:   Basic Metabolic Panel: Recent Labs  Lab 11/13/20 0012 11/13/20 0723 11/14/20 1043 11/15/20 0536  NA 125*  --  135  --   K 5.1  --  4.2  --   CL 98  --  108  --   CO2 19*  --  21*  --   GLUCOSE 432*  --  145*  --   BUN 27*  --  23*  --   CREATININE 1.17 0.83 0.91 0.87  CALCIUM 8.5*  --  8.7*  --    GFR Estimated Creatinine Clearance: 71.8 mL/min (by C-G formula based on SCr of 0.87 mg/dL). Liver Function Tests: No results for input(s): AST, ALT, ALKPHOS, BILITOT, PROT, ALBUMIN in the last 168 hours. No results for input(s): LIPASE, AMYLASE in the last 168  hours. No results for input(s): AMMONIA in the last 168 hours. Coagulation profile Recent Labs  Lab 11/14/20 0451  INR 1.0    CBC: Recent Labs  Lab 11/13/20 0011 11/13/20 0012 11/14/20 1043  WBC 2.0* 2.0* 5.3  NEUTROABS 1.3*  --  2.9  HGB 11.2* 11.2* 10.3*  HCT 33.2* 33.0* 30.3*  MCV 80.6 80.1 80.2  PLT 162 157 172   Cardiac Enzymes: No results for input(s): CKTOTAL, CKMB, CKMBINDEX, TROPONINI in the last 168 hours. BNP: Invalid input(s): POCBNP CBG: Recent Labs  Lab 11/14/20 0816 11/14/20 1203 11/14/20 1619 11/14/20 2132 11/15/20 0834  GLUCAP 139* 117* 157* 154* 139*   D-Dimer No results for input(s): DDIMER in the last 72 hours. Hgb A1c Recent Labs    11/13/20 0723  HGBA1C 8.6*   Lipid Profile No results for input(s): CHOL, HDL, LDLCALC, TRIG, CHOLHDL, LDLDIRECT in the last 72 hours. Thyroid function studies No results for input(s): TSH, T4TOTAL, T3FREE, THYROIDAB in the last 72 hours.  Invalid input(s):  FREET3 Anemia work up No results for input(s): VITAMINB12, FOLATE, FERRITIN, TIBC, IRON, RETICCTPCT in the last 72 hours. Microbiology Recent Results (from the past 240 hour(s))  Urine culture     Status: None   Collection Time: 11/13/20 12:11 AM   Specimen: In/Out Cath Urine  Result Value Ref Range Status   Specimen Description   Final    IN/OUT CATH URINE Performed at [facility], [address] [place], [place] [address]    Special Requests   Final    NONE Performed at [facility], [address] [place], [place] [address]    Culture   Final    NO GROWTH Performed at [facility], [address] [place], [place] [address]    Report Status 11/14/2020 FINAL  Final  Blood culture (single)     Status: None (Preliminary result)   Collection Time: 11/13/20  3:31 AM   Specimen: BLOOD  Result Value Ref Range Status   Specimen Description BLOOD LEFT AC  Final   Special Requests   Final    BOTTLES DRAWN AEROBIC AND  ANAEROBIC Blood Culture adequate volume   Culture   Final    NO GROWTH 2 DAYS Performed at [facility], [address] [place], [place] [address]    Report Status PENDING  Incomplete  MRSA PCR Screening     Status: None   Collection Time: 11/13/20 11:27 AM   Specimen: Nasal Mucosa; Nasopharyngeal  Result Value Ref Range Status   MRSA by PCR NEGATIVE NEGATIVE Final    Comment:        The GeneXpert MRSA Assay (FDA approved for NASAL specimens only), is one component of a comprehensive MRSA colonization surveillance program. It is not intended to diagnose MRSA infection nor to guide or monitor treatment for MRSA infections. Performed at [facility], [address] [place], [place] [address]      Discharge Instructions:   Discharge Instructions     Diet - low sodium heart healthy   Complete by: As directed    Diet Carb Modified   Complete by: As directed    Increase activity slowly   Complete by: As directed       Allergies as of 11/15/2020       Reactions   Insulin Aspart Itching   Pt states not allergic  Tolerates Lantus Pt states not allergic         Medication List     TAKE these medications    acetaminophen 500 MG tablet Commonly known as: TYLENOL Take 500-1,000 mg by mouth every 6 (six) hours as needed for mild pain or moderate pain.   empagliflozin 10 MG Tabs tablet Commonly known as: JARDIANCE Take 10 mg by mouth daily.   hydrochlorothiazide 25 MG tablet Commonly known as: HYDRODIURIL Take 0.5 tablets (12.5 mg total) by mouth daily.   insulin glargine 100 UNIT/ML injection Commonly known as: LANTUS Inject 15 Units into the skin at bedtime.   lisinopril 40 MG tablet Commonly known as: ZESTRIL Take 40 mg by mouth daily.   omeprazole 20 MG capsule Commonly known as: PRILOSEC Take 20 mg by mouth daily.   simvastatin 40 MG tablet Commonly known as: ZOCOR Take 40 mg by mouth daily.   tamsulosin 0.4 MG Caps  capsule Commonly known as: FLOMAX Take 0.4 mg by mouth daily.   Vitamin D (Ergocalciferol) 1.25 MG (50000 UNIT) Caps capsule Commonly known as: DRISDOL Take 50,000 Units by mouth every  7 (seven) days.        Follow-up Information     [facility]. Schedule an appointment as soon as possible for a visit in 2 week(s).   Specialty: Urology Contact information: [address] [place], [address] [place] [address] [phone]                 Time coordinating discharge: 28 minutes  Signed:  Lurene Shadow  [facility] 11/15/2020, 4:20 PM   Pager on [email or web address]. If 7PM-7AM, please contact night-coverage at [email or web address]

## 2020-11-15 NOTE — Plan of Care (Addendum)
Pt alert and oriented. Hydralazine prn given for HTN. Pt has been voiding during night without complications. Denies pain.  Problem: Education: Goal: Knowledge of General Education information will improve Description: Including pain rating scale, medication(s)/side effects and non-pharmacologic comfort measures Outcome: Progressing   Problem: Health Behavior/Discharge Planning: Goal: Ability to manage health-related needs will improve Outcome: Progressing   Problem: Clinical Measurements: Goal: Ability to maintain clinical measurements within normal limits will improve Outcome: Progressing Goal: Will remain free from infection Outcome: Progressing Goal: Diagnostic test results will improve Outcome: Progressing Goal: Respiratory complications will improve Outcome: Progressing Goal: Cardiovascular complication will be avoided Outcome: Progressing   Problem: Activity: Goal: Risk for activity intolerance will decrease Outcome: Progressing   Problem: Nutrition: Goal: Adequate nutrition will be maintained Outcome: Progressing   Problem: Coping: Goal: Level of anxiety will decrease Outcome: Progressing   Problem: Elimination: Goal: Will not experience complications related to bowel motility Outcome: Progressing Goal: Will not experience complications related to urinary retention Outcome: Progressing   Problem: Pain Managment: Goal: General experience of comfort will improve Outcome: Progressing   Problem: Safety: Goal: Ability to remain free from injury will improve Outcome: Progressing   Problem: Skin Integrity: Goal: Risk for impaired skin integrity will decrease Outcome: Progressing

## 2020-11-15 NOTE — Progress Notes (Signed)
   11/14/20 2049 11/15/20 0053  Assess: MEWS Score  BP (!) 183/96 (!) 143/79  MD contacted due to BP elevated. Hydralazine ordered prn. Med given and effective at decreasing BP. Pt educated on medication uses and side effects with interpreter services.

## 2020-11-18 LAB — CULTURE, BLOOD (SINGLE)
Culture: NO GROWTH
Special Requests: ADEQUATE

## 2022-03-03 ENCOUNTER — Other Ambulatory Visit: Payer: Self-pay

## 2022-03-03 ENCOUNTER — Emergency Department: Payer: BLUE CROSS/BLUE SHIELD

## 2022-03-03 ENCOUNTER — Encounter: Payer: Self-pay | Admitting: Emergency Medicine

## 2022-03-03 ENCOUNTER — Inpatient Hospital Stay
Admission: EM | Admit: 2022-03-03 | Discharge: 2022-03-07 | DRG: 683 | Disposition: A | Discharge status: Home / Self Care | Payer: BLUE CROSS/BLUE SHIELD | Attending: Internal Medicine | Admitting: Internal Medicine

## 2022-03-03 ENCOUNTER — Observation Stay: Payer: BLUE CROSS/BLUE SHIELD

## 2022-03-03 DIAGNOSIS — Z20822 Contact with and (suspected) exposure to covid-19: Secondary | ICD-10-CM | POA: Diagnosis present

## 2022-03-03 DIAGNOSIS — Z91199 Patient's noncompliance with other medical treatment and regimen due to unspecified reason: Secondary | ICD-10-CM

## 2022-03-03 DIAGNOSIS — E1122 Type 2 diabetes mellitus with diabetic chronic kidney disease: Secondary | ICD-10-CM | POA: Diagnosis present

## 2022-03-03 DIAGNOSIS — R42 Dizziness and giddiness: Principal | ICD-10-CM

## 2022-03-03 DIAGNOSIS — E1165 Type 2 diabetes mellitus with hyperglycemia: Secondary | ICD-10-CM | POA: Diagnosis present

## 2022-03-03 DIAGNOSIS — Z833 Family history of diabetes mellitus: Secondary | ICD-10-CM

## 2022-03-03 DIAGNOSIS — Z79899 Other long term (current) drug therapy: Secondary | ICD-10-CM

## 2022-03-03 DIAGNOSIS — E785 Hyperlipidemia, unspecified: Secondary | ICD-10-CM | POA: Diagnosis present

## 2022-03-03 DIAGNOSIS — N179 Acute kidney failure, unspecified: Principal | ICD-10-CM | POA: Diagnosis present

## 2022-03-03 DIAGNOSIS — E860 Dehydration: Secondary | ICD-10-CM | POA: Diagnosis present

## 2022-03-03 DIAGNOSIS — I129 Hypertensive chronic kidney disease with stage 1 through stage 4 chronic kidney disease, or unspecified chronic kidney disease: Secondary | ICD-10-CM | POA: Diagnosis present

## 2022-03-03 DIAGNOSIS — Z8673 Personal history of transient ischemic attack (TIA), and cerebral infarction without residual deficits: Secondary | ICD-10-CM

## 2022-03-03 DIAGNOSIS — Z87891 Personal history of nicotine dependence: Secondary | ICD-10-CM

## 2022-03-03 DIAGNOSIS — I10 Essential (primary) hypertension: Secondary | ICD-10-CM | POA: Diagnosis present

## 2022-03-03 DIAGNOSIS — I69351 Hemiplegia and hemiparesis following cerebral infarction affecting right dominant side: Secondary | ICD-10-CM

## 2022-03-03 DIAGNOSIS — N182 Chronic kidney disease, stage 2 (mild): Secondary | ICD-10-CM | POA: Diagnosis present

## 2022-03-03 DIAGNOSIS — E876 Hypokalemia: Secondary | ICD-10-CM | POA: Diagnosis present

## 2022-03-03 DIAGNOSIS — E119 Type 2 diabetes mellitus without complications: Secondary | ICD-10-CM

## 2022-03-03 DIAGNOSIS — Z794 Long term (current) use of insulin: Secondary | ICD-10-CM

## 2022-03-03 LAB — CBC WITH DIFFERENTIAL/PLATELET
Abs Immature Granulocytes: 0.02 10*3/uL (ref 0.00–0.07)
Basophils Absolute: 0 10*3/uL (ref 0.0–0.1)
Basophils Relative: 1 %
Eosinophils Absolute: 0 10*3/uL (ref 0.0–0.5)
Eosinophils Relative: 1 %
HCT: 35.7 % — ABNORMAL LOW (ref 39.0–52.0)
Hemoglobin: 11.2 g/dL — ABNORMAL LOW (ref 13.0–17.0)
Immature Granulocytes: 0 %
Lymphocytes Relative: 17 %
Lymphs Abs: 1 10*3/uL (ref 0.7–4.0)
MCH: 26.9 pg (ref 26.0–34.0)
MCHC: 31.4 g/dL (ref 30.0–36.0)
MCV: 85.8 fL (ref 80.0–100.0)
Monocytes Absolute: 0.2 10*3/uL (ref 0.1–1.0)
Monocytes Relative: 4 %
Neutro Abs: 4.7 10*3/uL (ref 1.7–7.7)
Neutrophils Relative %: 77 %
Platelets: 249 10*3/uL (ref 150–400)
RBC: 4.16 MIL/uL — ABNORMAL LOW (ref 4.22–5.81)
RDW: 13.2 % (ref 11.5–15.5)
WBC: 6 10*3/uL (ref 4.0–10.5)
nRBC: 0 % (ref 0.0–0.2)

## 2022-03-03 LAB — HIV ANTIBODY (ROUTINE TESTING W REFLEX): HIV Screen 4th Generation wRfx: NONREACTIVE

## 2022-03-03 LAB — COMPREHENSIVE METABOLIC PANEL
ALT: 13 U/L (ref 0–44)
AST: 20 U/L (ref 15–41)
Albumin: 4.5 g/dL (ref 3.5–5.0)
Alkaline Phosphatase: 63 U/L (ref 38–126)
Anion gap: 10 (ref 5–15)
BUN: 32 mg/dL — ABNORMAL HIGH (ref 6–20)
CO2: 24 mmol/L (ref 22–32)
Calcium: 9.4 mg/dL (ref 8.9–10.3)
Chloride: 106 mmol/L (ref 98–111)
Creatinine, Ser: 1.49 mg/dL — ABNORMAL HIGH (ref 0.61–1.24)
GFR, Estimated: 56 mL/min — ABNORMAL LOW (ref >60)
Glucose, Bld: 296 mg/dL — ABNORMAL HIGH (ref 70–99)
Potassium: 4.9 mmol/L (ref 3.5–5.1)
Sodium: 140 mmol/L (ref 135–145)
Total Bilirubin: 1.1 mg/dL (ref 0.3–1.2)
Total Protein: 7.9 g/dL (ref 6.5–8.1)

## 2022-03-03 LAB — URINALYSIS, ROUTINE W REFLEX MICROSCOPIC
Bilirubin Urine: NEGATIVE
Glucose, UA: >=500 mg/dL — AB
Hgb urine dipstick: NEGATIVE
Ketones, ur: NEGATIVE mg/dL
Leukocytes,Ua: NEGATIVE
Nitrite: NEGATIVE
Protein, ur: 100 mg/dL — AB
Specific Gravity, Urine: 1.011 (ref 1.005–1.030)
Squamous Epithelial / HPF: NONE SEEN (ref 0–5)
pH: 8 (ref 5.0–8.0)

## 2022-03-03 LAB — TROPONIN I (HIGH SENSITIVITY): Troponin I (High Sensitivity): 5 ng/L (ref <18)

## 2022-03-03 LAB — GLUCOSE, CAPILLARY
Glucose-Capillary: 144 mg/dL — ABNORMAL HIGH (ref 70–99)
Glucose-Capillary: 160 mg/dL — ABNORMAL HIGH (ref 70–99)
Glucose-Capillary: 161 mg/dL — ABNORMAL HIGH (ref 70–99)

## 2022-03-03 LAB — LIPASE, BLOOD: Lipase: 50 U/L (ref 11–51)

## 2022-03-03 LAB — RESP PANEL BY RT-PCR (FLU A&B, COVID) ARPGX2
Influenza A by PCR: NEGATIVE
Influenza B by PCR: NEGATIVE
SARS Coronavirus 2 by RT PCR: NEGATIVE

## 2022-03-03 LAB — HEMOGLOBIN A1C
Hgb A1c MFr Bld: 9.5 % — ABNORMAL HIGH (ref 4.8–5.6)
Mean Plasma Glucose: 225.95 mg/dL

## 2022-03-03 LAB — CBG MONITORING, ED: Glucose-Capillary: 252 mg/dL — ABNORMAL HIGH (ref 70–99)

## 2022-03-03 MED ORDER — MECLIZINE HCL 25 MG PO TABS: 25.0000 mg | ORAL_TABLET | Freq: Three times a day (TID) | ORAL | 1 refills | Status: DC | PRN

## 2022-03-03 MED ORDER — INSULIN GLARGINE-YFGN 100 UNIT/ML ~~LOC~~ SOLN
15.0000 [IU] | Freq: Every day | SUBCUTANEOUS | Status: DC
  Administered 2022-03-03 – 2022-03-06 (×4): 15 [IU] via SUBCUTANEOUS
  Filled 2022-03-03 (×6): qty 0.15

## 2022-03-03 MED ORDER — SODIUM CHLORIDE 0.9 % IV BOLUS
1000.0000 mL | Freq: Once | INTRAVENOUS | Status: AC
  Administered 2022-03-03: 1000 mL via INTRAVENOUS

## 2022-03-03 MED ORDER — METOCLOPRAMIDE HCL 5 MG/ML IJ SOLN
10.0000 mg | Freq: Two times a day (BID) | INTRAMUSCULAR | Status: AC
  Administered 2022-03-03 – 2022-03-04 (×2): 10 mg via INTRAVENOUS
  Filled 2022-03-03 (×2): qty 2

## 2022-03-03 MED ORDER — ASPIRIN 81 MG PO TBEC
81.0000 mg | DELAYED_RELEASE_TABLET | Freq: Every day | ORAL | Status: DC
  Administered 2022-03-04 – 2022-03-07 (×4): 81 mg via ORAL
  Filled 2022-03-03 (×4): qty 1

## 2022-03-03 MED ORDER — DIAZEPAM 5 MG/ML IJ SOLN
2.5000 mg | Freq: Once | INTRAMUSCULAR | Status: AC
  Administered 2022-03-03: 2.5 mg via INTRAVENOUS
  Filled 2022-03-03: qty 2

## 2022-03-03 MED ORDER — TAMSULOSIN HCL 0.4 MG PO CAPS: 0.4000 mg | ORAL_CAPSULE | Freq: Every day | ORAL | Status: DC

## 2022-03-03 MED ORDER — METOCLOPRAMIDE HCL 5 MG/ML IJ SOLN
10.0000 mg | INTRAMUSCULAR | Status: AC
  Administered 2022-03-03: 10 mg via INTRAVENOUS
  Filled 2022-03-03: qty 2

## 2022-03-03 MED ORDER — PANTOPRAZOLE SODIUM 40 MG IV SOLR
40.0000 mg | INTRAVENOUS | Status: DC
  Administered 2022-03-03 – 2022-03-04 (×2): 40 mg via INTRAVENOUS
  Filled 2022-03-03 (×2): qty 10

## 2022-03-03 MED ORDER — SODIUM CHLORIDE 0.45 % IV SOLN: INTRAVENOUS | Status: AC

## 2022-03-03 MED ORDER — METOCLOPRAMIDE HCL 10 MG PO TABS: 10.0000 mg | ORAL_TABLET | Freq: Four times a day (QID) | ORAL | 0 refills | Status: DC | PRN

## 2022-03-03 MED ORDER — ONDANSETRON HCL 4 MG/2ML IJ SOLN
4.0000 mg | Freq: Once | INTRAMUSCULAR | Status: AC
  Administered 2022-03-03: 4 mg via INTRAVENOUS
  Filled 2022-03-03: qty 2

## 2022-03-03 MED ORDER — ONDANSETRON HCL 4 MG/2ML IJ SOLN
4.0000 mg | Freq: Four times a day (QID) | INTRAMUSCULAR | Status: DC | PRN
  Administered 2022-03-04: 4 mg via INTRAVENOUS
  Filled 2022-03-03: qty 2

## 2022-03-03 MED ORDER — ACETAMINOPHEN 325 MG PO TABS: 650.0000 mg | ORAL_TABLET | Freq: Four times a day (QID) | ORAL | Status: DC | PRN

## 2022-03-03 MED ORDER — SIMVASTATIN 20 MG PO TABS
40.0000 mg | ORAL_TABLET | Freq: Every evening | ORAL | Status: DC
  Administered 2022-03-04 – 2022-03-06 (×3): 40 mg via ORAL
  Filled 2022-03-03 (×3): qty 2

## 2022-03-03 MED ORDER — DIPHENHYDRAMINE HCL 50 MG/ML IJ SOLN
25.0000 mg | Freq: Once | INTRAMUSCULAR | Status: AC
  Administered 2022-03-03: 25 mg via INTRAVENOUS
  Filled 2022-03-03: qty 1

## 2022-03-03 MED ORDER — ACETAMINOPHEN 500 MG PO TABS: 500.0000 mg | ORAL_TABLET | Freq: Four times a day (QID) | ORAL | Status: DC | PRN

## 2022-03-03 MED ORDER — ACETAMINOPHEN 650 MG RE SUPP: 650.0000 mg | Freq: Four times a day (QID) | RECTAL | Status: DC | PRN

## 2022-03-03 MED ORDER — IOHEXOL 350 MG/ML SOLN
75.0000 mL | Freq: Once | INTRAVENOUS | Status: AC | PRN
  Administered 2022-03-03: 75 mL via INTRAVENOUS

## 2022-03-03 MED ORDER — HYDRALAZINE HCL 20 MG/ML IJ SOLN
5.0000 mg | Freq: Four times a day (QID) | INTRAMUSCULAR | Status: DC | PRN
  Administered 2022-03-03: 5 mg via INTRAVENOUS
  Filled 2022-03-03: qty 1

## 2022-03-03 MED ORDER — ONDANSETRON HCL 4 MG PO TABS: 4.0000 mg | ORAL_TABLET | Freq: Four times a day (QID) | ORAL | Status: DC | PRN

## 2022-03-03 MED ORDER — MECLIZINE HCL 25 MG PO TABS
25.0000 mg | ORAL_TABLET | Freq: Three times a day (TID) | ORAL | Status: DC
  Administered 2022-03-03 – 2022-03-07 (×12): 25 mg via ORAL
  Filled 2022-03-03 (×12): qty 1

## 2022-03-03 NOTE — Discharge Instructions (Addendum)
Your lab tests and MRI of the brian do not show any acute issues today.  Your covid and flu tests were negative.  Please continue to rest and monitor your symptoms. Follow up with your primary care doctor if symptoms have not resolved by tomorrow.  Intensive Outpatient Programs   [facility] [address] [place],  [place], [place] (Inpatient and [phone] (Suboxone and Methadone) [address] [phone]  [facility]: [facility] Programs - Intensive Outpatient [place] [address] [place], [place], [place] (Outpatient, Inpatient, [facility] (Groups and Residental) (insurance only) [place], [place]   [facility] (for caregivers and family) [name] [address], [place], [place]  Residential Treatment Programs  [facility](2 years) Residential: 15 days)[facility] ([place], [place], [place] or [phone]  [facility] [place], [place], [place]  [place] (RTS) [place], [place], [place] Admissions: 8am-3pm M-F  [facility] Program: Residential Program [phone] Days)             ADATC: [facility]  [place], [place], [place] or [phone] in Hours over the weekend or by referral)   [place]  Crisis: [facility]:[phone] (for crisis response 24 hours a day)

## 2022-03-03 NOTE — Assessment & Plan Note (Signed)
Most likely related to GI losses from nausea and vomiting Patient has a baseline serum creatinine of 0.87 but today on admission it is 1.49 Hold hydrochlorothiazide and lisinopril for now Judicious IV fluid hydration Repeat renal parameters in a.m.

## 2022-03-03 NOTE — ED Triage Notes (Addendum)
Pt to triage via w/c, eyes closed; son st he has been having dizziness accomp by N/V since 2am; denies any pain

## 2022-03-03 NOTE — H&P (Signed)
History and Physical    Patient: Adrian Taylor [id number]:[id number] DOB: [birth year] DOA: 03/03/2022 DOS: the patient was seen and examined on 03/03/2022 PCP: [facility] [place]  Patient coming from: Home  Chief Complaint:  Chief Complaint  Patient presents with   Emesis   Dizziness   HPI: Adrian Taylor is a 51 y.o. male with medical history significant for prior CVA with right-sided hemiparesis, diabetes mellitus, hypertension who presents to the ER for evaluation of sudden onset dizziness last night at about 2 AM on the day of admission associated with nausea, chills and vomiting. Patient has been unable to ambulate due to the dizziness.  Dizziness is improved while patient is laying still and at rest but worse with any form of movement.  He denies having any headache, no tinnitus, no hearing loss, no falls or recent trauma.  He denies having any vision changes, no paresthesias, no difficulty swallowing or any motor weakness. He denies having any chest pain, no shortness of breath, no headache, no leg swelling, no changes in his bowel habits, no focal deficits He had an MRI of the brain done in the ER due to concerns for an acute CVA which was negative. Patient remains symptomatic and unable to walk due to dizziness. He will be referred to observation status for further evaluation    Review of Systems: As mentioned in the history of present illness. All other systems reviewed and are negative. Past Medical History:  Diagnosis Date   Diabetes mellitus without complication ([place])    Hypertension    Past Surgical History:  Procedure Laterality Date   INCISION AND DRAINAGE Right 03/18/2019   Procedure: INCISION AND DRAINAGE;  Surgeon: Samara Deist, DPM;  Location: [facility] ORS;  Service: Podiatry;  Laterality: Right;   LUMBAR LAMINECTOMY/DECOMPRESSION MICRODISCECTOMY N/A 08/03/2017   Procedure: LUMBAR LAMINECTOMY/DECOMPRESSION MICRODISCECTOMY L1-4;   Surgeon: Meade Maw, MD;  Location: [facility] ORS;  Service: Neurosurgery;  Laterality: N/A;   Social History:  reports that he quit smoking about 19 years ago. His smoking use included cigarettes. He has never used smokeless tobacco. He reports current alcohol use. No history on file for drug use.  Allergies  Allergen Reactions   Insulin Aspart Itching    Pt states not allergic  Tolerates Lantus Pt states not allergic     Family History  Problem Relation Age of Onset   Diabetes Mother     Prior to Admission medications   Medication Sig Start Date End Date Taking? Authorizing Provider  meclizine (ANTIVERT) 25 MG tablet Take 1 tablet (25 mg total) by mouth 3 (three) times daily as needed for dizziness or nausea. 03/03/22  Yes Carrie Mew, MD  metoCLOPramide (REGLAN) 10 MG tablet Take 1 tablet (10 mg total) by mouth every 6 (six) hours as needed. 03/03/22  Yes Carrie Mew, MD  acetaminophen (TYLENOL) 500 MG tablet Take 500-1,000 mg by mouth every 6 (six) hours as needed for mild pain or moderate pain.    [provider]  empagliflozin (JARDIANCE) 10 MG TABS tablet Take 10 mg by mouth daily.    [provider]  hydrochlorothiazide (HYDRODIURIL) 25 MG tablet Take 0.5 tablets (12.5 mg total) by mouth daily. 11/15/20   Jennye Boroughs, MD  insulin glargine (LANTUS) 100 UNIT/ML injection Inject 15 Units into the skin at bedtime.    [provider]  lisinopril (ZESTRIL) 40 MG tablet Take 40 mg by mouth daily.    [provider]  omeprazole ([place])  20 MG capsule Take 20 mg by mouth daily.    [provider]  simvastatin (ZOCOR) 40 MG tablet Take 40 mg by mouth daily.    [provider]  tamsulosin (FLOMAX) 0.4 MG CAPS capsule Take 0.4 mg by mouth daily.    [provider]  Vitamin D, Ergocalciferol, (DRISDOL) 1.25 MG (50000 UNIT) CAPS capsule Take 50,000 Units by mouth every 7 (seven) days.    [provider]     Physical Exam: Vitals:   03/03/22 0900 03/03/22 0930 03/03/22 1115 03/03/22 1132  BP: (!) 162/85 (!) 175/98  (!) 179/105  Pulse: 87 92 91 93  Resp: 17   18  Temp:    98.5 F (36.9 C)  TempSrc:    Oral  SpO2: 95% 98% 94% 95%  Weight:      Height:       Physical Exam Vitals and nursing note reviewed.  Constitutional:      Appearance: He is ill-appearing.  HENT:     Head: Normocephalic and atraumatic.     Nose: Nose normal.     Mouth/Throat:     Mouth: Mucous membranes are moist.  Eyes:     Conjunctiva/sclera: Conjunctivae normal.  Cardiovascular:     Rate and Rhythm: Normal rate and regular rhythm.  Pulmonary:     Effort: Pulmonary effort is normal.     Breath sounds: Normal breath sounds.  Abdominal:     General: Abdomen is flat. Bowel sounds are normal.     Palpations: Abdomen is soft.  Musculoskeletal:        General: Normal range of motion.     Cervical back: Normal range of motion and neck supple.  Skin:    General: Skin is warm and dry.  Neurological:     General: No focal deficit present.  Psychiatric:        Mood and Affect: Mood normal.        Behavior: Behavior normal.     Data Reviewed: Relevant notes from primary care and specialist visits, past discharge summaries as available in EHR, including Care Everywhere. Prior diagnostic testing as pertinent to current admission diagnoses Updated medications and problem lists for reconciliation ED course, including vitals, labs, imaging, treatment and response to treatment Triage notes, nursing and pharmacy notes and ED provider's notes Notable results as noted in HPI Labs reviewed.  Sodium 140, potassium 4.9, chloride 106, bicarb 24, glucose 296, BUN 32, creatinine 1.49 compared to baseline of 0.87, calcium 9.4, total protein 7.9, albumin 4.5, AST 20, ALT 13, alk phos 63, white count 6.0, hemoglobin 11.2, hematocrit 35.7, platelet count 249 MRI of the brain without contrast shows no acute intracranial  abnormality.Chronic hemorrhagic infarct or remote small vessel hemorrhage of the deep left frontal lobe white matter (left external capsule and corona radiata). Possible small chronic lacunar infarct of the right thalamus. And nonspecific generalized cerebral volume loss for age. Twelve-lead EKG reviewed by me shows normal sinus rhythm There are no new results to review at this time.  Assessment and Plan: * Dizziness Patient presents for evaluation of sudden onset dizziness associated with nausea and vomiting Concern for possible posterior circulation stroke versus vertigo Awaiting results of CT angiogram of the head and neck MRI of the brain is negative for an acute stroke Neurology consult  Diabetes mellitus without complication ([place]) Keep patient n.p.o. for now due to nausea and vomiting Continue long-acting insulin Check blood sugars every 4 hours  Hypertension Hold hydrochlorothiazide and lisinopril  for now due to AKI  AKI (acute kidney injury) ([place]) Most likely related to GI losses from nausea and vomiting Patient has a baseline serum creatinine of 0.87 but today on admission it is 1.49 Hold hydrochlorothiazide and lisinopril for now Judicious IV fluid hydration Repeat renal parameters in a.m.  History of CVA (cerebrovascular accident) Patient has a history of a prior CVA with right-sided hemiparesis Continue statins Optimize blood pressure control      Advance Care Planning:   Code Status: Full Code   Consults: Neurology  Family Communication: Greater than 50% of time was spent discussing patient's condition and plan of care with him and his family at the bedside.  All questions and concerns have been addressed.  They verbalized understanding and agree with the plan.  Severity of Illness: The appropriate patient status for this patient is OBSERVATION. Observation status is judged to be reasonable and necessary in order to provide the required intensity of service to  ensure the patient's safety. The patient's presenting symptoms, physical exam findings, and initial radiographic and laboratory data in the context of their medical condition is felt to place them at decreased risk for further clinical deterioration. Furthermore, it is anticipated that the patient will be medically stable for discharge from the hospital within 2 midnights of admission.   Author: Collier Bullock, MD 03/03/2022 1:29 PM  For on call review [email or web address].

## 2022-03-03 NOTE — ED Notes (Signed)
69 yom with a c/c of dizziness, vomiting, nausea, and chills since 2 am this morning. The pt advised he feels weak. The pt did need some assistance when ambulating to the bed. The pt was alert and oriented x 4. The pt was warm, slightly pale, and dry. Son will assist with interpreting.

## 2022-03-03 NOTE — Assessment & Plan Note (Signed)
Keep patient n.p.o. for now due to nausea and vomiting Continue long-acting insulin Check blood sugars every 4 hours

## 2022-03-03 NOTE — Plan of Care (Signed)

## 2022-03-03 NOTE — Assessment & Plan Note (Signed)
Hold hydrochlorothiazide and lisinopril for now due to AKI

## 2022-03-03 NOTE — Assessment & Plan Note (Signed)
Patient presents for evaluation of sudden onset dizziness associated with nausea and vomiting Concern for possible posterior circulation stroke versus vertigo Awaiting results of CT angiogram of the head and neck MRI of the brain is negative for an acute stroke Neurology consult

## 2022-03-03 NOTE — ED Provider Notes (Addendum)
[facility] Provider Note    Event Date/Time   First MD Initiated Contact with Patient 03/03/22 [id number]     (approximate)   History   Chief Complaint: Emesis and Dizziness   HPI  Adrian Taylor is a 51 y.o. male with a past history of hypertension and diabetes who comes ED complaining of dizziness that started at 2:00 AM associated with nausea and vomiting.  Denies any headache or other pain.  No fever or chills.  Before this he was in his usual state of health.  No sick contacts.  No falls or head trauma.  Denies any vision changes paresthesias or motor weakness.     Physical Exam   Triage Vital Signs: ED Triage Vitals  Enc Vitals Group     BP 03/03/22 0618 (!) 164/102     Pulse Rate 03/03/22 0618 88     Resp 03/03/22 0618 20     Temp 03/03/22 0618 97.7 F (36.5 C)     Temp Source 03/03/22 0618 Oral     SpO2 03/03/22 0618 97 %     Weight 03/03/22 0612 120 lb (54.4 kg)     Height 03/03/22 0612 5' (1.524 m)     Head Circumference --      Peak Flow --      Pain Score 03/03/22 0612 0     Pain Loc --      Pain Edu? --      Excl. in GC? --     Most recent vital signs: Vitals:   03/03/22 0830 03/03/22 0900  BP: (!) 154/82 (!) 162/85  Pulse: 90 87  Resp:  17  Temp:    SpO2: 97% 95%    General: Awake, no distress.  CV:  Good peripheral perfusion.  Regular rate and rhythm.  Normal distal pulses Resp:  Normal effort.  Clear to auscultation bilaterally Abd:  No distention.  Soft nontender Other:  No lower extremity edema.  Dry mucous membranes.  EOMI, PERRL without nystagmus.   ED Results / Procedures / Treatments   Labs (all labs ordered are listed, but only abnormal results are displayed) Labs Reviewed  CBC WITH DIFFERENTIAL/PLATELET - Abnormal; Notable for the following components:      Result Value   RBC 4.16 (*)    Hemoglobin 11.2 (*)    HCT 35.7 (*)    All other components within normal limits  COMPREHENSIVE METABOLIC  PANEL - Abnormal; Notable for the following components:   Glucose, Bld 296 (*)    BUN 32 (*)    Creatinine, Ser 1.49 (*)    GFR, Estimated 56 (*)    All other components within normal limits  URINALYSIS, ROUTINE W REFLEX MICROSCOPIC - Abnormal; Notable for the following components:   Color, Urine STRAW (*)    APPearance CLEAR (*)    Glucose, UA >=500 (*)    Protein, ur 100 (*)    Bacteria, UA RARE (*)    All other components within normal limits  CBG MONITORING, ED - Abnormal; Notable for the following components:   Glucose-Capillary 252 (*)    All other components within normal limits  RESP PANEL BY RT-PCR (FLU A&B, COVID) [id number]  LIPASE, BLOOD  TROPONIN I (HIGH SENSITIVITY)     EKG Interpreted by me Normal sinus rhythm rate of 86.  Normal axis, normal intervals.  Normal QRS ST segments and T waves.   RADIOLOGY    PROCEDURES:  Procedures   MEDICATIONS  ORDERED IN ED: Medications  ondansetron (ZOFRAN) injection 4 mg (4 mg Intravenous Given 03/03/22 0626)  sodium chloride 0.9 % bolus 1,000 mL (0 mLs Intravenous Stopped 03/03/22 0854)  metoCLOPramide (REGLAN) injection 10 mg (10 mg Intravenous Given 03/03/22 0752)  diphenhydrAMINE (BENADRYL) injection 25 mg (25 mg Intravenous Given 03/03/22 0750)     IMPRESSION / MDM / ASSESSMENT AND PLAN / ED COURSE  I reviewed the triage vital signs and the nursing notes.                              Differential diagnosis includes, but is not limited to, dehydration, electrolyte abnormality, viral illness, AKI, anemia, cerebellar stroke  Patient's presentation is most consistent with acute presentation with potential threat to life or bodily function.  Patient presents with acute onset of dizziness.  Vital signs are normal, not in distress.  Initial serum labs show AKI consistent with dehydration.  We will give IV fluids, antiemetics.  Check flu/COVID.  If viral tests are negative and patient's not feeling better, MRI will be  needed to evaluate for posterior stroke.  ----------------------------------------- 11:13 AM on 03/03/2022 ----------------------------------------- Serum labs unremarkable.  COVID and flu negative.  MRI brain negative for acute findings.  Patient reports symptoms are not improved, still very dizzy.  With standing, he is not able to maintain balance and would fall if not fully assisted.  This is an acute issue for him.  I worry about vertebrobasilar insufficiency.  We will plan to hospitalize for further management.   ----------------------------------------- 12:02 PM on 03/03/2022 ----------------------------------------- Case discussed with hospitalist.     FINAL CLINICAL IMPRESSION(S) / ED DIAGNOSES   Final diagnoses:  Dizziness  Type 2 diabetes mellitus with hyperglycemia, with long-term current use of insulin ([place])     Rx / DC Orders   ED Discharge Orders          Ordered    metoCLOPramide (REGLAN) 10 MG tablet  Every 6 hours PRN        03/03/22 1111    meclizine (ANTIVERT) 25 MG tablet  3 times daily PRN        03/03/22 1111             Note:  This document was prepared using Dragon voice recognition software and may include unintentional dictation errors.   Carrie Mew, MD 03/03/22 1114    Carrie Mew, MD 03/03/22 [place]    Carrie Mew, MD 03/03/22 1202

## 2022-03-03 NOTE — ED Notes (Signed)
Patient transported to MRI 

## 2022-03-03 NOTE — ED Notes (Signed)
MD notified patient is too weak to stand, therefore being unsafe to discharge home per this RN's judgement. MD to speak with hospitalist. Patient and family updated that they are no longer being discharged home at this time and will undergo further treatment

## 2022-03-03 NOTE — Assessment & Plan Note (Signed)
Patient has a history of a prior CVA with right-sided hemiparesis Continue statins Optimize blood pressure control

## 2022-03-04 DIAGNOSIS — N179 Acute kidney failure, unspecified: Secondary | ICD-10-CM

## 2022-03-04 DIAGNOSIS — I10 Essential (primary) hypertension: Secondary | ICD-10-CM | POA: Diagnosis not present

## 2022-03-04 DIAGNOSIS — E785 Hyperlipidemia, unspecified: Secondary | ICD-10-CM | POA: Diagnosis present

## 2022-03-04 DIAGNOSIS — E876 Hypokalemia: Secondary | ICD-10-CM | POA: Diagnosis present

## 2022-03-04 DIAGNOSIS — E1122 Type 2 diabetes mellitus with diabetic chronic kidney disease: Secondary | ICD-10-CM | POA: Diagnosis present

## 2022-03-04 DIAGNOSIS — Z833 Family history of diabetes mellitus: Secondary | ICD-10-CM | POA: Diagnosis not present

## 2022-03-04 DIAGNOSIS — Z20822 Contact with and (suspected) exposure to covid-19: Secondary | ICD-10-CM | POA: Diagnosis present

## 2022-03-04 DIAGNOSIS — E119 Type 2 diabetes mellitus without complications: Secondary | ICD-10-CM

## 2022-03-04 DIAGNOSIS — E860 Dehydration: Secondary | ICD-10-CM | POA: Diagnosis present

## 2022-03-04 DIAGNOSIS — Z79899 Other long term (current) drug therapy: Secondary | ICD-10-CM | POA: Diagnosis not present

## 2022-03-04 DIAGNOSIS — E1165 Type 2 diabetes mellitus with hyperglycemia: Secondary | ICD-10-CM | POA: Diagnosis present

## 2022-03-04 DIAGNOSIS — N182 Chronic kidney disease, stage 2 (mild): Secondary | ICD-10-CM | POA: Diagnosis present

## 2022-03-04 DIAGNOSIS — Z91199 Patient's noncompliance with other medical treatment and regimen due to unspecified reason: Secondary | ICD-10-CM | POA: Diagnosis not present

## 2022-03-04 DIAGNOSIS — I69351 Hemiplegia and hemiparesis following cerebral infarction affecting right dominant side: Secondary | ICD-10-CM | POA: Diagnosis not present

## 2022-03-04 DIAGNOSIS — Z87891 Personal history of nicotine dependence: Secondary | ICD-10-CM | POA: Diagnosis not present

## 2022-03-04 DIAGNOSIS — Z794 Long term (current) use of insulin: Secondary | ICD-10-CM | POA: Diagnosis not present

## 2022-03-04 DIAGNOSIS — I129 Hypertensive chronic kidney disease with stage 1 through stage 4 chronic kidney disease, or unspecified chronic kidney disease: Secondary | ICD-10-CM | POA: Diagnosis present

## 2022-03-04 DIAGNOSIS — Z8673 Personal history of transient ischemic attack (TIA), and cerebral infarction without residual deficits: Secondary | ICD-10-CM | POA: Diagnosis not present

## 2022-03-04 DIAGNOSIS — R42 Dizziness and giddiness: Secondary | ICD-10-CM | POA: Diagnosis present

## 2022-03-04 LAB — GLUCOSE, CAPILLARY
Glucose-Capillary: 134 mg/dL — ABNORMAL HIGH (ref 70–99)
Glucose-Capillary: 130 mg/dL — ABNORMAL HIGH (ref 70–99)
Glucose-Capillary: 250 mg/dL — ABNORMAL HIGH (ref 70–99)
Glucose-Capillary: 143 mg/dL — ABNORMAL HIGH (ref 70–99)
Glucose-Capillary: 160 mg/dL — ABNORMAL HIGH (ref 70–99)

## 2022-03-04 LAB — BASIC METABOLIC PANEL
Anion gap: 9 (ref 5–15)
BUN: 33 mg/dL — ABNORMAL HIGH (ref 6–20)
CO2: 24 mmol/L (ref 22–32)
Calcium: 8.7 mg/dL — ABNORMAL LOW (ref 8.9–10.3)
Chloride: 112 mmol/L — ABNORMAL HIGH (ref 98–111)
Creatinine, Ser: 1.75 mg/dL — ABNORMAL HIGH (ref 0.61–1.24)
GFR, Estimated: 47 mL/min — ABNORMAL LOW (ref >60)
Glucose, Bld: 138 mg/dL — ABNORMAL HIGH (ref 70–99)
Potassium: 3.7 mmol/L (ref 3.5–5.1)
Sodium: 145 mmol/L (ref 135–145)

## 2022-03-04 LAB — LIPID PANEL
Cholesterol: 256 mg/dL — ABNORMAL HIGH (ref 0–200)
HDL: 43 mg/dL (ref >40)
LDL Cholesterol: 186 mg/dL — ABNORMAL HIGH (ref 0–99)
Total CHOL/HDL Ratio: 6 RATIO
Triglycerides: 136 mg/dL (ref <150)
VLDL: 27 mg/dL (ref 0–40)

## 2022-03-04 LAB — CBC
HCT: 31.8 % — ABNORMAL LOW (ref 39.0–52.0)
Hemoglobin: 10.1 g/dL — ABNORMAL LOW (ref 13.0–17.0)
MCH: 27.4 pg (ref 26.0–34.0)
MCHC: 31.8 g/dL (ref 30.0–36.0)
MCV: 86.4 fL (ref 80.0–100.0)
Platelets: 228 10*3/uL (ref 150–400)
RBC: 3.68 MIL/uL — ABNORMAL LOW (ref 4.22–5.81)
RDW: 13.6 % (ref 11.5–15.5)
WBC: 5.6 10*3/uL (ref 4.0–10.5)
nRBC: 0 % (ref 0.0–0.2)

## 2022-03-04 MED ORDER — LACTATED RINGERS IV SOLN: INTRAVENOUS | Status: AC

## 2022-03-04 MED ORDER — LORAZEPAM 1 MG PO TABS: 1.0000 mg | ORAL_TABLET | ORAL | Status: DC | PRN

## 2022-03-04 MED ORDER — INSULIN ASPART 100 UNIT/ML IJ SOLN: 0.0000 [IU] | Freq: Every day | INTRAMUSCULAR | Status: DC

## 2022-03-04 MED ORDER — ADULT MULTIVITAMIN W/MINERALS CH
1.0000 | ORAL_TABLET | Freq: Every day | ORAL | Status: DC
  Administered 2022-03-04 – 2022-03-07 (×4): 1 via ORAL
  Filled 2022-03-04 (×4): qty 1

## 2022-03-04 MED ORDER — DIPHENHYDRAMINE HCL 25 MG PO CAPS: 25.0000 mg | ORAL_CAPSULE | Freq: Three times a day (TID) | ORAL | Status: DC | PRN

## 2022-03-04 MED ORDER — INSULIN ASPART 100 UNIT/ML IJ SOLN
0.0000 [IU] | Freq: Three times a day (TID) | INTRAMUSCULAR | Status: DC
  Administered 2022-03-04: 2 [IU] via SUBCUTANEOUS
  Administered 2022-03-05 – 2022-03-06 (×3): 3 [IU] via SUBCUTANEOUS
  Filled 2022-03-04 (×4): qty 1

## 2022-03-04 MED ORDER — FOLIC ACID 1 MG PO TABS
1.0000 mg | ORAL_TABLET | Freq: Every day | ORAL | Status: DC
  Administered 2022-03-04 – 2022-03-07 (×4): 1 mg via ORAL
  Filled 2022-03-04 (×4): qty 1

## 2022-03-04 MED ORDER — LORAZEPAM 2 MG/ML IJ SOLN: 1.0000 mg | INTRAMUSCULAR | Status: DC | PRN

## 2022-03-04 MED ORDER — THIAMINE HCL 100 MG/ML IJ SOLN: 100.0000 mg | Freq: Every day | INTRAMUSCULAR | Status: DC

## 2022-03-04 MED ORDER — THIAMINE MONONITRATE 100 MG PO TABS
100.0000 mg | ORAL_TABLET | Freq: Every day | ORAL | Status: DC
  Administered 2022-03-04 – 2022-03-07 (×4): 100 mg via ORAL
  Filled 2022-03-04 (×4): qty 1

## 2022-03-04 NOTE — Progress Notes (Addendum)
Progress Note    Adrian Taylor  [id number] DOB: December 27, 1970  DOA: 03/03/2022 PCP: [facility] [place]      Brief Narrative:    Medical records reviewed and are as summarized below:  Vinit Gills is a 51 y.o. male with medical history significant for prior CVA with right-sided hemiparesis, diabetes mellitus, hypertension, alcohol use disorder, who presented to the hospital because of nausea, chills, vomiting and dizziness.  He had difficulty ambulating because of dizziness.  Dizziness improved at rest or when he is laying down but it's worse with movement.  MRI did not show any evidence of acute stroke.  He was found to have acute kidney injury.  He was treated with IV fluids.  HCTZ and lisinopril were held because of AKI.   Assessment/Plan:   Principal Problem:   Dizziness Active Problems:   History of CVA (cerebrovascular accident)   AKI (acute kidney injury) ([place])   Hypertension   Diabetes mellitus without complication ([place])   Body mass index is 23.44 kg/m.   Dizziness: No evidence of acute stroke on MRI brain.  Probably from dehydration/AKI.  Orthostatic vital signs are as follows:  Lying BP 171/87, sitting BP 157/98, standing BP 159/89.  AKI: Creatinine is trending upward (from 1.49-1.75).  Baseline creatinine is around 0.9.  Restart IV fluids with Ringer's lactate infusion.  Monitor BMP.  Lisinopril and HCTZ have been held.    Hypertension: Hold HCTZ and lisinopril  Type II DM with hyperglycemia: Continue insulin glargine.  Add NovoLog sliding scale.  Hemoglobin A1c was 9.5.  History of stroke, hyperlipidemia: Continue aspirin and simvastatin Lipid panel showed total cholesterol 256, HDL 43, LDL 186, triglycerides 136.  Medical adherence may be an issue.  Alcohol use disorder: His son said patient drinks on a regular basis.  Use Ativan as needed per CIWA protocol for alcohol withdrawal syndrome.  Plan of care was  discussed with patient's wife and son at the bedside  Diet Order             Diet Carb Modified Fluid consistency: Thin; Room service appropriate? Yes  Diet effective now                            Consultants: None  Procedures: None    Medications:    aspirin EC  81 mg Oral Daily   insulin glargine-yfgn  15 Units Subcutaneous QHS   meclizine  25 mg Oral TID   pantoprazole (PROTONIX) IV  40 mg Intravenous Q24H   simvastatin  40 mg Oral QPM   Continuous Infusions:  lactated ringers 100 mL/hr at 03/04/22 [id number]     Anti-infectives (From admission, onward)    None              Family Communication/Anticipated D/C date and plan/Code Status   DVT prophylaxis: SCDs Start: 03/03/22 1317     Code Status: Full Code  Family Communication: Plan discussed with his wife and son at the bedside Disposition Plan: Possible discharge home tomorrow   Status is: Observation The patient will require care spanning > 2 midnights and should be moved to inpatient because: IV fluids for AKI, dizziness       Subjective:   Interval events noted.  He complains of nausea and dizziness.  Dizziness is worse with ambulation.  Objective:    Vitals:   03/03/22 1735 03/03/22 1900 03/03/22 2343 03/04/22 [id number]  BP: (!) 178/106 (!) 148/73 (!) 149/85 133/73  Pulse: 99  (!) 102 98  Resp: 18  20 18   Temp: 98.4 F (36.9 C)  98 F (36.7 C) 98.8 F (37.1 C)  TempSrc:    Oral  SpO2: 100%  100% 100%  Weight:      Height:       No data found.   Intake/Output Summary (Last 24 hours) at 03/04/2022 1007 Last data filed at 03/03/2022 2343 Gross per 24 hour  Intake 123.3 ml  Output 150 ml  Net -26.7 ml   Filed Weights   03/03/22 0612  Weight: 54.4 kg    Exam:  GEN: NAD SKIN: Warm and dry EYES: EOMI, PERRLA, no nystagmus ENT: MMM CV: RRR PULM: CTA B ABD: soft, ND, NT, +BS CNS: AAO x 3, non focal EXT: No edema or tenderness        Data Reviewed:    I have personally reviewed following labs and imaging studies:  Labs: Labs show the following:   Basic Metabolic Panel: Recent Labs  Lab 03/03/22 0624 03/04/22 0455  NA 140 145  K 4.9 3.7  CL 106 112*  CO2 24 24  GLUCOSE 296* 138*  BUN 32* 33*  CREATININE 1.49* 1.75*  CALCIUM 9.4 8.7*   GFR Estimated Creatinine Clearance: 35.3 mL/min (A) (by C-G formula based on SCr of 1.75 mg/dL (H)). Liver Function Tests: Recent Labs  Lab 03/03/22 0624  AST 20  ALT 13  ALKPHOS 63  BILITOT 1.1  PROT 7.9  ALBUMIN 4.5   Recent Labs  Lab 03/03/22 0624  LIPASE 50   No results for input(s): "AMMONIA" in the last 168 hours. Coagulation profile No results for input(s): "INR", "PROTIME" in the last 168 hours.  CBC: Recent Labs  Lab 03/03/22 0624 03/04/22 0455  WBC 6.0 5.6  NEUTROABS 4.7  --   HGB 11.2* 10.1*  HCT 35.7* 31.8*  MCV 85.8 86.4  PLT 249 228   Cardiac Enzymes: No results for input(s): "CKTOTAL", "CKMB", "CKMBINDEX", "TROPONINI" in the last 168 hours. BNP (last 3 results) No results for input(s): "PROBNP" in the last 8760 hours. CBG: Recent Labs  Lab 03/03/22 2051 03/03/22 2148 03/03/22 2347 03/04/22 0422 03/04/22 0819  GLUCAP 160* 161* 144* 134* 130*   D-Dimer: No results for input(s): "DDIMER" in the last 72 hours. Hgb A1c: Recent Labs    03/03/22 1520  HGBA1C 9.5*   Lipid Profile: No results for input(s): "CHOL", "HDL", "LDLCALC", "TRIG", "CHOLHDL", "LDLDIRECT" in the last 72 hours. Thyroid function studies: No results for input(s): "TSH", "T4TOTAL", "T3FREE", "THYROIDAB" in the last 72 hours.  Invalid input(s): "FREET3" Anemia work up: No results for input(s): "VITAMINB12", "FOLATE", "FERRITIN", "TIBC", "IRON", "RETICCTPCT" in the last 72 hours. Sepsis Labs: Recent Labs  Lab 03/03/22 0624 03/04/22 0455  WBC 6.0 5.6    Microbiology Recent Results (from the past 240 hour(s))  Resp Panel by RT-PCR (Flu A&B, Covid) Anterior Nasal Swab      Status: None   Collection Time: 03/03/22  7:57 AM   Specimen: Anterior Nasal Swab  Result Value Ref Range Status   SARS Coronavirus 2 by RT PCR NEGATIVE NEGATIVE Final    Comment: (NOTE) SARS-CoV-2 target nucleic acids are NOT DETECTED.  The SARS-CoV-2 RNA is generally detectable in upper respiratory specimens during the acute phase of infection. The lowest concentration of SARS-CoV-2 viral copies this assay can detect is 138 copies/mL. A negative result does not preclude SARS-Cov-2 infection and should  not be used as the sole basis for treatment or other patient management decisions. A negative result may occur with  improper specimen collection/handling, submission of specimen other than nasopharyngeal swab, presence of viral mutation(s) within the areas targeted by this assay, and inadequate number of viral copies(<138 copies/mL). A negative result must be combined with clinical observations, patient history, and epidemiological information. The expected result is Negative.  Fact Sheet for Patients:  EntrepreneurPulse.com.au  Fact Sheet for Healthcare Providers:  IncredibleEmployment.be  This test is no t yet approved or cleared by the [place] FDA and  has been authorized for detection and/or diagnosis of SARS-CoV-2 by FDA under an Emergency Use Authorization (EUA). This EUA will remain  in effect (meaning this test can be used) for the duration of the COVID-19 declaration under Section 564(b)(1) of the Act, 21 U.S.C.section 360bbb-3(b)(1), unless the authorization is terminated  or revoked sooner.       Influenza A by PCR NEGATIVE NEGATIVE Final   Influenza B by PCR NEGATIVE NEGATIVE Final    Comment: (NOTE) The Xpert Xpress SARS-CoV-2/FLU/RSV plus assay is intended as an aid in the diagnosis of influenza from Nasopharyngeal swab specimens and should not be used as a sole basis for treatment. Nasal washings and aspirates are  unacceptable for Xpert Xpress SARS-CoV-2/FLU/RSV testing.  Fact Sheet for Patients: EntrepreneurPulse.com.au  Fact Sheet for Healthcare Providers: IncredibleEmployment.be  This test is not yet approved or cleared by the [place] FDA and has been authorized for detection and/or diagnosis of SARS-CoV-2 by FDA under an Emergency Use Authorization (EUA). This EUA will remain in effect (meaning this test can be used) for the duration of the COVID-19 declaration under Section 564(b)(1) of the Act, 21 U.S.C. section 360bbb-3(b)(1), unless the authorization is terminated or revoked.  Performed at [facility], [address] [place], [place] [address]     Procedures and diagnostic studies:  CT ANGIO HEAD NECK W WO CM  Result Date: 03/03/2022 CLINICAL DATA:  Dizziness, persistent/recurrent, cardiac or vascular cause suspected. EXAM: CT ANGIOGRAPHY HEAD AND NECK TECHNIQUE: Multidetector CT imaging of the head and neck was performed using the standard protocol during bolus administration of intravenous contrast. Multiplanar CT image reconstructions and MIPs were obtained to evaluate the vascular anatomy. Carotid stenosis measurements (when applicable) are obtained utilizing NASCET criteria, using the distal internal carotid diameter as the denominator. RADIATION DOSE REDUCTION: This exam was performed according to the departmental dose-optimization program which includes automated exposure control, adjustment of the mA and/or kV according to patient size and/or use of iterative reconstruction technique. CONTRAST:  3mL OMNIPAQUE IOHEXOL 350 MG/ML SOLN COMPARISON:  Head MRI 03/03/2022 FINDINGS: CT HEAD FINDINGS Brain: There is no evidence of an acute infarct, intracranial hemorrhage, mass, midline shift, or extra-axial fluid collection. Encephalomalacia is again noted in the left external capsule and corona radiata. Cerebral atrophy is moderately  advanced for age. Vascular: Calcified atherosclerosis at the skull base. Skull: No fracture or suspicious osseous lesion. Sinuses/Orbits: Mild mucosal thickening in the paranasal sinuses. Clear mastoid air cells. Unremarkable orbits. Other: None. Review of the MIP images confirms the above findings CTA NECK FINDINGS Aortic arch: Standard 3 vessel aortic arch with widely patent arch vessel origins. Right carotid system: Patent with a moderate amount of eccentric, calcified plaque at the carotid bifurcation. No evidence of a significant stenosis or dissection. Left carotid system: Patent with a small to moderate amount of predominantly calcified plaque at the carotid bifurcation. No evidence of a significant stenosis  or dissection. Retropharyngeal course of the mid cervical ICA. Vertebral arteries: Patent and codominant without evidence of a significant stenosis or dissection. Skeleton: No suspicious osseous lesion. Other neck: No evidence of cervical lymphadenopathy or mass. Upper chest: No apical lung consolidation. Review of the MIP images confirms the above findings CTA HEAD FINDINGS Anterior circulation: The internal carotid arteries are patent from skull base to carotid termini with siphon atherosclerosis resulting in up to mild stenosis bilaterally. ACAs and MCAs are patent without evidence of a proximal branch occlusion or significant proximal stenosis. No aneurysm is identified. Posterior circulation: The intracranial vertebral arteries are patent to the basilar with mild atherosclerotic narrowing bilaterally, left greater than right. Patent right PICA, left AICA, and bilateral SCA origins are visualized. The basilar artery is widely patent. Posterior communicating arteries are diminutive or absent. Both PCAs are patent without evidence of a significant proximal stenosis on the left. There are tandem severe right P2 stenoses. No aneurysm is identified. Venous sinuses: Patent. Anatomic variants: None. Review  of the MIP images confirms the above findings IMPRESSION: 1. Intracranial atherosclerosis with severe right P2 and mild bilateral ICA stenoses. 2. Moderate cervical carotid artery atherosclerosis without significant stenosis. 3. Patent vertebral arteries with mild V4 stenoses. Electronically Signed   By: Logan Bores M.D.   On: 03/03/2022 13:54   MR BRAIN WO CONTRAST  Result Date: 03/03/2022 CLINICAL DATA:  51 year old male with persistent dizziness. Nausea vomiting. Weakness. EXAM: MRI HEAD WITHOUT CONTRAST TECHNIQUE: Multiplanar, multiecho pulse sequences of the brain and surrounding structures were obtained without intravenous contrast. COMPARISON:  None Available. FINDINGS: Brain: Cerebral volume loss for age, appears fairly generalized. No restricted diffusion to suggest acute infarction. No midline shift, mass effect, evidence of mass lesion, ventriculomegaly, extra-axial collection or acute intracranial hemorrhage. Cervicomedullary junction and pituitary are within normal limits. Chronic white matter encephalomalacia tracking along the left external capsule with dense associated hemosiderin, such as from previous hemorrhagic white matter infarct or prior small-vessel hemorrhage. Some of the posterior left corona radiata is affected. No other chronic cerebral blood products identified. No cortical encephalomalacia identified. Suspicion of a small chronic lacunar infarct of the lateral right thalamus on series 10, image 12. But otherwise deep gray nuclei, brainstem, and cerebellum signal remains normal. Vascular: Major intracranial vascular flow voids are preserved. Skull and upper cervical spine: Negative visible cervical spine. Visualized bone marrow signal is within normal limits. Sinuses/Orbits: Negative orbits. Mild paranasal sinus mucosal thickening, primarily the right maxillary sinus. No sinus fluid level. Other: Mastoids are clear. Visible internal auditory structures appear normal. Negative  visible scalp and face. IMPRESSION: 1. No acute intracranial abnormality. 2. But there is a chronic hemorrhagic infarct or remote small vessel hemorrhage of the deep left frontal lobe white matter (left external capsule and corona radiata). Possible small chronic lacunar infarct of the right thalamus. And nonspecific generalized cerebral volume loss for age. Electronically Signed   By: Genevie Ann M.D.   On: 03/03/2022 10:57               LOS: 0 days   Avir Deruiter  [facility]   Pager on [email or web address]. If 7PM-7AM, please contact night-coverage at [email or web address]     03/04/2022, 10:07 AM

## 2022-03-04 NOTE — Plan of Care (Signed)

## 2022-03-04 NOTE — Plan of Care (Signed)
  Problem: Education: Goal: Knowledge of General Education information will improve Description: Including pain rating scale, medication(s)/side effects and non-pharmacologic comfort measures Outcome: Progressing   Problem: Clinical Measurements: Goal: Ability to maintain clinical measurements within normal limits will improve Outcome: Progressing   Problem: Nutrition: Goal: Adequate nutrition will be maintained Outcome: Progressing   Problem: Activity: Goal: Risk for activity intolerance will decrease Outcome: Progressing   Problem: Coping: Goal: Level of anxiety will decrease Outcome: Progressing   Problem: Pain Managment: Goal: General experience of comfort will improve Outcome: Progressing   Problem: Education: Goal: Ability to describe self-care measures that may prevent or decrease complications (Diabetes Survival Skills Education) will improve Outcome: Progressing

## 2022-03-04 NOTE — TOC Initial Note (Signed)
Transition of Care [facility]) - Initial/Assessment Note    Patient Details  Name: Adrian Taylor MRN: [id number] Date of Birth: 02-09-1971  [facility] of [facility]) CM/SW Contact:    Conception Oms, RN Phone Number: 03/04/2022, 8:43 AM  Clinical Narrative:                  Transition of Care [facility]) Screening Note   Patient Details  Name: Adrian Taylor Date of Birth: 08/22/1970   Transition of Care [facility]) CM/SW Contact:    Conception Oms, RN Phone Number: 03/04/2022, 8:43 AM  [facility] to [facility] at [facility] Son to transport  Transition of Care Department [facility]) has reviewed patient and no TOC needs have been identified at this time. We will continue to monitor patient advancement through interdisciplinary progression rounds. If new patient transition needs arise, please place a TOC consult.    Expected Discharge Plan: Home/Self Care Barriers to Discharge: Continued Medical Work up   Patient Goals and CMS Choice        Expected Discharge Plan and Services Expected Discharge Plan: Home/Self Care                                              Prior Living Arrangements/Services                       Activities of Daily Living Home Assistive Devices/Equipment: None ADL Screening (condition at time of admission) Patient's cognitive ability adequate to safely complete daily activities?: Yes Is the patient deaf or have difficulty hearing?: No Does the patient have difficulty seeing, even when wearing glasses/contacts?: No Does the patient have difficulty concentrating, remembering, or making decisions?: No Patient able to express need for assistance with ADLs?: Yes Does the patient have difficulty dressing or bathing?: No Independently performs ADLs?: Yes (appropriate for developmental age) Does the patient have difficulty walking or climbing stairs?: No Weakness of Legs: None Weakness of Arms/Hands:  None  Permission Sought/Granted                  Emotional Assessment              Admission diagnosis:  Dizziness [R42] Type 2 diabetes mellitus with hyperglycemia, with long-term current use of insulin ([place]) [E11.65, Z79.4] Patient Active Problem List   Diagnosis Date Noted   Dizziness 03/03/2022   AKI (acute kidney injury) ([place]) 03/03/2022   Hypertension    Diabetes mellitus without complication ([place])    Leukopenia 11/13/2020   Acute urinary retention 11/13/2020   Hyponatremia 11/13/2020   Hyperglycemia due to type 2 diabetes mellitus ([place]) 11/13/2020   History of CVA (cerebrovascular accident) 11/13/2020   Cellulitis 03/16/2019   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

## 2022-03-05 DIAGNOSIS — Z8673 Personal history of transient ischemic attack (TIA), and cerebral infarction without residual deficits: Secondary | ICD-10-CM | POA: Diagnosis not present

## 2022-03-05 DIAGNOSIS — N179 Acute kidney failure, unspecified: Secondary | ICD-10-CM | POA: Diagnosis not present

## 2022-03-05 DIAGNOSIS — R42 Dizziness and giddiness: Secondary | ICD-10-CM | POA: Diagnosis not present

## 2022-03-05 LAB — GLUCOSE, CAPILLARY
Glucose-Capillary: 101 mg/dL — ABNORMAL HIGH (ref 70–99)
Glucose-Capillary: 159 mg/dL — ABNORMAL HIGH (ref 70–99)
Glucose-Capillary: 103 mg/dL — ABNORMAL HIGH (ref 70–99)
Glucose-Capillary: 168 mg/dL — ABNORMAL HIGH (ref 70–99)

## 2022-03-05 LAB — BASIC METABOLIC PANEL
Anion gap: 5 (ref 5–15)
BUN: 26 mg/dL — ABNORMAL HIGH (ref 6–20)
CO2: 24 mmol/L (ref 22–32)
Calcium: 8.6 mg/dL — ABNORMAL LOW (ref 8.9–10.3)
Chloride: 110 mmol/L (ref 98–111)
Creatinine, Ser: 1.52 mg/dL — ABNORMAL HIGH (ref 0.61–1.24)
GFR, Estimated: 55 mL/min — ABNORMAL LOW (ref >60)
Glucose, Bld: 152 mg/dL — ABNORMAL HIGH (ref 70–99)
Potassium: 3.6 mmol/L (ref 3.5–5.1)
Sodium: 139 mmol/L (ref 135–145)

## 2022-03-05 MED ORDER — PANTOPRAZOLE SODIUM 40 MG PO TBEC
40.0000 mg | DELAYED_RELEASE_TABLET | Freq: Every day | ORAL | Status: DC
  Administered 2022-03-06 – 2022-03-07 (×2): 40 mg via ORAL
  Filled 2022-03-05 (×2): qty 1

## 2022-03-05 MED ORDER — HYDRALAZINE HCL 20 MG/ML IJ SOLN
10.0000 mg | Freq: Four times a day (QID) | INTRAMUSCULAR | Status: DC | PRN
  Administered 2022-03-06: 10 mg via INTRAVENOUS
  Filled 2022-03-05: qty 1

## 2022-03-05 MED ORDER — PANTOPRAZOLE SODIUM 40 MG PO TBEC
40.0000 mg | DELAYED_RELEASE_TABLET | Freq: Every day | ORAL | Status: DC
  Administered 2022-03-05: 40 mg via ORAL
  Filled 2022-03-05: qty 1

## 2022-03-05 NOTE — Progress Notes (Addendum)
Progress Note    Adrian Taylor  [id number] DOB: November 07, 1970  DOA: 03/03/2022 PCP: [facility] [place]      Brief Narrative:    Medical records reviewed and are as summarized below:  Adrian Taylor is a 51 y.o. male with medical history significant for prior CVA with right-sided hemiparesis, diabetes mellitus, hypertension, alcohol use disorder, who presented to the hospital because of nausea, chills, vomiting and dizziness.  He had difficulty ambulating because of dizziness.  Dizziness improved at rest or when he is laying down but it's worse with movement.  MRI did not show any evidence of acute stroke.  He was found to have acute kidney injury.  He was treated with IV fluids.  HCTZ and lisinopril were held because of AKI.   Assessment/Plan:   Principal Problem:   Dizziness Active Problems:   History of CVA (cerebrovascular accident)   AKI (acute kidney injury) ([place])   Hypertension   Diabetes mellitus without complication ([place])   Body mass index is 23.44 kg/m.   Dizziness: No evidence of acute stroke on MRI brain.  Probably from dehydration/AKI.  No orthostatic hypotension.  AKI: Creatinine is trending down (improved from 1.75-1.52).  Baseline creatinine is around 0.9.  Continue IV fluids and monitor BMP.  Lisinopril and HCTZ have been held.    Hypertension: Use IV hydralazine as needed for severe hypertension.  Lisinopril and HCTZ have been held  Type II DM with hyperglycemia: Continue insulin glargine.  NovoLog as needed for hyperglycemia.  Hemoglobin A1c was 9.5.  History of stroke, hyperlipidemia: Continue aspirin and simvastatin Lipid panel showed total cholesterol 256, HDL 43, LDL 186, triglycerides 136.  Alcohol use disorder: His son said patient drinks on a regular basis.  Use Ativan as needed per CIWA protocol for alcohol withdrawal syndrome.  Medical nonadherence: The importance of medical adherence was  reiterated.  Encouraged ambulation  Plan of care was discussed with the patient, his wife and 2 sons at the bedside  Diet Order             Diet Carb Modified Fluid consistency: Thin; Room service appropriate? Yes  Diet effective now                            Consultants: None  Procedures: None    Medications:    aspirin EC  81 mg Oral Daily   folic acid  1 mg Oral Daily   insulin aspart  0-15 Units Subcutaneous TID WC   insulin aspart  0-5 Units Subcutaneous QHS   insulin glargine-yfgn  15 Units Subcutaneous QHS   meclizine  25 mg Oral TID   multivitamin with minerals  1 tablet Oral Daily   [START ON 03/06/2022] pantoprazole  40 mg Oral Daily   simvastatin  40 mg Oral QPM   thiamine  100 mg Oral Daily   Or   thiamine  100 mg Intravenous Daily   Continuous Infusions:  lactated ringers 100 mL/hr at 03/05/22 1236     Anti-infectives (From admission, onward)    None              Family Communication/Anticipated D/C date and plan/Code Status   DVT prophylaxis: SCDs Start: 03/03/22 1317     Code Status: Full Code  Family Communication: Plan discussed with his wife and 2 sons at the bedside Disposition Plan: Possible discharge home tomorrow   Status is:  Inpatient Remains inpatient appropriate because: IV fluids         Subjective:   Interval events noted.  He feels a little better today.  Dizziness is improving.  He was helped to the bathroom and dizziness was not as pronounced.  His wife and 2 sons were at the bedside.  Objective:    Vitals:   03/04/22 1657 03/04/22 2350 03/05/22 0732 03/05/22 1510  BP: (!) 165/85 137/79 (!) 156/82 (!) 187/96  Pulse: 88 84 84 88  Resp: 17 20    Temp: 98.4 F (36.9 C) 98.3 F (36.8 C) 98.8 F (37.1 C) 97.9 F (36.6 C)  TempSrc:      SpO2: 100% 100% 98% 100%  Weight:      Height:       No data found.   Intake/Output Summary (Last 24 hours) at 03/05/2022 1512 Last data filed at  03/05/2022 [id number] Gross per 24 hour  Intake 26.72 ml  Output 575 ml  Net -548.28 ml   Filed Weights   03/03/22 0612  Weight: 54.4 kg    Exam:  GEN: NAD SKIN: No rash EYES: EOMI ENT: MMM CV: RRR PULM: CTA B ABD: soft, ND, NT, +BS CNS: AAO x 3, non focal EXT: No edema or tenderness        Data Reviewed:   I have personally reviewed following labs and imaging studies:  Labs: Labs show the following:   Basic Metabolic Panel: Recent Labs  Lab 03/03/22 0624 03/04/22 0455 03/05/22 0543  NA 140 145 139  K 4.9 3.7 3.6  CL 106 112* 110  CO2 24 24 24   GLUCOSE 296* 138* 152*  BUN 32* 33* 26*  CREATININE 1.49* 1.75* 1.52*  CALCIUM 9.4 8.7* 8.6*   GFR Estimated Creatinine Clearance: 40.7 mL/min (A) (by C-G formula based on SCr of 1.52 mg/dL (H)). Liver Function Tests: Recent Labs  Lab 03/03/22 0624  AST 20  ALT 13  ALKPHOS 63  BILITOT 1.1  PROT 7.9  ALBUMIN 4.5   Recent Labs  Lab 03/03/22 0624  LIPASE 50   No results for input(s): "AMMONIA" in the last 168 hours. Coagulation profile No results for input(s): "INR", "PROTIME" in the last 168 hours.  CBC: Recent Labs  Lab 03/03/22 0624 03/04/22 0455  WBC 6.0 5.6  NEUTROABS 4.7  --   HGB 11.2* 10.1*  HCT 35.7* 31.8*  MCV 85.8 86.4  PLT 249 228   Cardiac Enzymes: No results for input(s): "CKTOTAL", "CKMB", "CKMBINDEX", "TROPONINI" in the last 168 hours. BNP (last 3 results) No results for input(s): "PROBNP" in the last 8760 hours. CBG: Recent Labs  Lab 03/04/22 1126 03/04/22 1652 03/04/22 2059 03/05/22 0733 03/05/22 1115  GLUCAP 250* 143* 160* 101* 159*   D-Dimer: No results for input(s): "DDIMER" in the last 72 hours. Hgb A1c: Recent Labs    03/03/22 1520  HGBA1C 9.5*   Lipid Profile: Recent Labs    03/04/22 0516  CHOL 256*  HDL 43  LDLCALC 186*  TRIG 136  CHOLHDL 6.0   Thyroid function studies: No results for input(s): "TSH", "T4TOTAL", "T3FREE", "THYROIDAB" in the last  72 hours.  Invalid input(s): "FREET3" Anemia work up: No results for input(s): "VITAMINB12", "FOLATE", "FERRITIN", "TIBC", "IRON", "RETICCTPCT" in the last 72 hours. Sepsis Labs: Recent Labs  Lab 03/03/22 0624 03/04/22 0455  WBC 6.0 5.6    Microbiology Recent Results (from the past 240 hour(s))  Resp Panel by RT-PCR (Flu A&B, Covid) Anterior Nasal Swab  Status: None   Collection Time: 03/03/22  7:57 AM   Specimen: Anterior Nasal Swab  Result Value Ref Range Status   SARS Coronavirus 2 by RT PCR NEGATIVE NEGATIVE Final    Comment: (NOTE) SARS-CoV-2 target nucleic acids are NOT DETECTED.  The SARS-CoV-2 RNA is generally detectable in upper respiratory specimens during the acute phase of infection. The lowest concentration of SARS-CoV-2 viral copies this assay can detect is 138 copies/mL. A negative result does not preclude SARS-Cov-2 infection and should not be used as the sole basis for treatment or other patient management decisions. A negative result may occur with  improper specimen collection/handling, submission of specimen other than nasopharyngeal swab, presence of viral mutation(s) within the areas targeted by this assay, and inadequate number of viral copies(<138 copies/mL). A negative result must be combined with clinical observations, patient history, and epidemiological information. The expected result is Negative.  Fact Sheet for Patients:  EntrepreneurPulse.com.au  Fact Sheet for Healthcare Providers:  IncredibleEmployment.be  This test is no t yet approved or cleared by the [place] FDA and  has been authorized for detection and/or diagnosis of SARS-CoV-2 by FDA under an Emergency Use Authorization (EUA). This EUA will remain  in effect (meaning this test can be used) for the duration of the COVID-19 declaration under Section 564(b)(1) of the Act, 21 U.S.C.section 360bbb-3(b)(1), unless the authorization is  terminated  or revoked sooner.       Influenza A by PCR NEGATIVE NEGATIVE Final   Influenza B by PCR NEGATIVE NEGATIVE Final    Comment: (NOTE) The Xpert Xpress SARS-CoV-2/FLU/RSV plus assay is intended as an aid in the diagnosis of influenza from Nasopharyngeal swab specimens and should not be used as a sole basis for treatment. Nasal washings and aspirates are unacceptable for Xpert Xpress SARS-CoV-2/FLU/RSV testing.  Fact Sheet for Patients: EntrepreneurPulse.com.au  Fact Sheet for Healthcare Providers: IncredibleEmployment.be  This test is not yet approved or cleared by the [place] FDA and has been authorized for detection and/or diagnosis of SARS-CoV-2 by FDA under an Emergency Use Authorization (EUA). This EUA will remain in effect (meaning this test can be used) for the duration of the COVID-19 declaration under Section 564(b)(1) of the Act, 21 U.S.C. section 360bbb-3(b)(1), unless the authorization is terminated or revoked.  Performed at [facility], [address] [place], [place] [address]     Procedures and diagnostic studies:  No results found.             LOS: 1 day   Darius Fillingim  [facility]   Pager on [email or web address]. If 7PM-7AM, please contact night-coverage at [email or web address]     03/05/2022, 3:12 PM

## 2022-03-05 NOTE — Plan of Care (Signed)

## 2022-03-05 NOTE — Plan of Care (Signed)
  Problem: Education: Goal: Knowledge of General Education information will improve Description: Including pain rating scale, medication(s)/side effects and non-pharmacologic comfort measures Outcome: Progressing   Problem: Health Behavior/Discharge Planning: Goal: Ability to manage health-related needs will improve Outcome: Progressing   Problem: Clinical Measurements: Goal: Ability to maintain clinical measurements within normal limits will improve Outcome: Progressing Goal: Will remain free from infection Outcome: Progressing Goal: Diagnostic test results will improve Outcome: Progressing Goal: Respiratory complications will improve Outcome: Progressing Goal: Cardiovascular complication will be avoided Outcome: Progressing   Problem: Activity: Goal: Risk for activity intolerance will decrease Outcome: Progressing   Problem: Nutrition: Goal: Adequate nutrition will be maintained Outcome: Progressing   Problem: Elimination: Goal: Will not experience complications related to bowel motility Outcome: Progressing Goal: Will not experience complications related to urinary retention Outcome: Progressing   Problem: Pain Managment: Goal: General experience of comfort will improve Outcome: Progressing   Problem: Safety: Goal: Ability to remain free from injury will improve Outcome: Progressing   Problem: Skin Integrity: Goal: Risk for impaired skin integrity will decrease Outcome: Progressing   Problem: Coping: Goal: Ability to adjust to condition or change in health will improve Outcome: Progressing   Problem: Fluid Volume: Goal: Ability to maintain a balanced intake and output will improve Outcome: Progressing   Problem: Health Behavior/Discharge Planning: Goal: Ability to identify and utilize available resources and services will improve Outcome: Progressing Goal: Ability to manage health-related needs will improve Outcome: Progressing   Problem:  Metabolic: Goal: Ability to maintain appropriate glucose levels will improve Outcome: Progressing   Problem: Nutritional: Goal: Maintenance of adequate nutrition will improve Outcome: Progressing Goal: Progress toward achieving an optimal weight will improve Outcome: Progressing   Problem: Skin Integrity: Goal: Risk for impaired skin integrity will decrease Outcome: Progressing   Problem: Tissue Perfusion: Goal: Adequacy of tissue perfusion will improve Outcome: Progressing   Problem: Coping: Goal: Level of anxiety will decrease Outcome: Not Applicable

## 2022-03-06 DIAGNOSIS — R42 Dizziness and giddiness: Secondary | ICD-10-CM | POA: Diagnosis not present

## 2022-03-06 DIAGNOSIS — Z8673 Personal history of transient ischemic attack (TIA), and cerebral infarction without residual deficits: Secondary | ICD-10-CM | POA: Diagnosis not present

## 2022-03-06 DIAGNOSIS — N179 Acute kidney failure, unspecified: Secondary | ICD-10-CM | POA: Diagnosis not present

## 2022-03-06 LAB — BASIC METABOLIC PANEL
Anion gap: 5 (ref 5–15)
BUN: 21 mg/dL — ABNORMAL HIGH (ref 6–20)
CO2: 26 mmol/L (ref 22–32)
Calcium: 8.6 mg/dL — ABNORMAL LOW (ref 8.9–10.3)
Chloride: 109 mmol/L (ref 98–111)
Creatinine, Ser: 1.26 mg/dL — ABNORMAL HIGH (ref 0.61–1.24)
GFR, Estimated: >60 mL/min (ref >60)
Glucose, Bld: 72 mg/dL (ref 70–99)
Potassium: 3.4 mmol/L — ABNORMAL LOW (ref 3.5–5.1)
Sodium: 140 mmol/L (ref 135–145)

## 2022-03-06 LAB — GLUCOSE, CAPILLARY
Glucose-Capillary: 83 mg/dL (ref 70–99)
Glucose-Capillary: 97 mg/dL (ref 70–99)
Glucose-Capillary: 189 mg/dL — ABNORMAL HIGH (ref 70–99)
Glucose-Capillary: 164 mg/dL — ABNORMAL HIGH (ref 70–99)
Glucose-Capillary: 116 mg/dL — ABNORMAL HIGH (ref 70–99)

## 2022-03-06 LAB — PHOSPHORUS: Phosphorus: 3.9 mg/dL (ref 2.5–4.6)

## 2022-03-06 LAB — MAGNESIUM: Magnesium: 1.9 mg/dL (ref 1.7–2.4)

## 2022-03-06 MED ORDER — POLYETHYLENE GLYCOL 3350 17 G PO PACK
17.0000 g | PACK | Freq: Every day | ORAL | Status: DC
  Administered 2022-03-06 – 2022-03-07 (×2): 17 g via ORAL
  Filled 2022-03-06 (×2): qty 1

## 2022-03-06 MED ORDER — LACTATED RINGERS IV SOLN: INTRAVENOUS | Status: DC

## 2022-03-06 MED ORDER — POTASSIUM CHLORIDE CRYS ER 20 MEQ PO TBCR
40.0000 meq | EXTENDED_RELEASE_TABLET | Freq: Once | ORAL | Status: AC
  Administered 2022-03-06: 40 meq via ORAL
  Filled 2022-03-06: qty 2

## 2022-03-06 NOTE — Progress Notes (Addendum)
Progress Note    Adrian Taylor  SAY:[id number] DOB: 1971/02/26  DOA: 03/03/2022 PCP: [facility]      Brief Narrative:    Medical records reviewed and are as summarized below:  Adrian Taylor is a 51 y.o. male with medical history significant for prior CVA with right-sided hemiparesis, diabetes mellitus, hypertension, alcohol use disorder, who presented to the hospital because of nausea, chills, vomiting and dizziness.  He had difficulty ambulating because of dizziness.  Dizziness improved at rest or when he is laying down but it's worse with movement.  MRI did not show any evidence of acute stroke.  He was found to have acute kidney injury.  He was treated with IV fluids.  HCTZ and lisinopril were held because of AKI.   Assessment/Plan:   Principal Problem:   Dizziness Active Problems:   History of CVA (cerebrovascular accident)   AKI (acute kidney injury) ([facility])   Hypertension   Diabetes mellitus without complication ([facility])   Body mass index is 23.44 kg/m.   Dizziness: Slowly improving.  No evidence of acute stroke on MRI brain.  Probably from dehydration/AKI.  No orthostatic hypotension.  AKI: Slowly improving but is not back to baseline.  Restart IV fluids and repeat BMP tomorrow.  Hopefully creatinine will be better enough to be able to restart lisinopril and HCTZ for hypertension.     Hypertension: Use IV hydralazine as needed for severe hypertension.  Lisinopril and HCTZ have been held  Hypokalemia: Replete potassium and monitor levels.  Check magnesium and phosphorus levels.  Type II DM with hyperglycemia: Continue insulin glargine.  NovoLog as needed for hyperglycemia.  Hemoglobin A1c was 9.5.  History of stroke, hyperlipidemia: Continue aspirin and simvastatin Lipid panel showed total cholesterol 256, HDL 43, LDL 186, triglycerides 136.  Alcohol use disorder: His son said patient drinks on a regular basis.  Use  Ativan as needed per CIWA protocol for alcohol withdrawal syndrome.  Medical nonadherence: The importance of medical adherence was reiterated.  Encouraged ambulation   Diet Order             Diet Carb Modified Fluid consistency: Thin; Room service appropriate? Yes  Diet effective now                            Consultants: None  Procedures: None    Medications:    aspirin EC  81 mg Oral Daily   folic acid  1 mg Oral Daily   insulin aspart  0-15 Units Subcutaneous TID WC   insulin aspart  0-5 Units Subcutaneous QHS   insulin glargine-yfgn  15 Units Subcutaneous QHS   meclizine  25 mg Oral TID   multivitamin with minerals  1 tablet Oral Daily   pantoprazole  40 mg Oral Daily   polyethylene glycol  17 g Oral Daily   simvastatin  40 mg Oral QPM   thiamine  100 mg Oral Daily   Or   thiamine  100 mg Intravenous Daily   Continuous Infusions:  lactated ringers 50 mL/hr at 03/06/22 1039     Anti-infectives (From admission, onward)    None              Family Communication/Anticipated D/C date and plan/Code Status   DVT prophylaxis: SCDs Start: 03/03/22 1317     Code Status: Full Code  Family Communication: Plan discussed with his 3 sons at the  bedside Disposition Plan: Possible discharge home tomorrow   Status is: Inpatient Remains inpatient appropriate because: IV fluids         Subjective:   Interval events noted.  He feels better today.  He feels that he is getting better and better every day.  He still has some dizziness but it is not as bad.  He was able to ambulate to the bathroom by himself.  His 3 sons were at the bedside.  Objective:    Vitals:   03/05/22 0732 03/05/22 1510 03/06/22 0009 03/06/22 0754  BP: (!) 156/82 (!) 187/96 132/70 (!) 176/92  Pulse: 84 88 80 85  Resp:   17 20  Temp: 98.8 F (37.1 C) 97.9 F (36.6 C)  97.7 F (36.5 C)  TempSrc:      SpO2: 98% 100% 100% 98%  Weight:      Height:        No data found.  No intake or output data in the 24 hours ending 03/06/22 1412  Filed Weights   03/03/22 0612  Weight: 54.4 kg    Exam:   GEN: NAD SKIN: No rash EYES: EOMI ENT: MMM CV: RRR PULM: CTA B ABD: soft, ND, NT, +BS CNS: AAO x 3, non focal EXT: No edema or tenderness       Data Reviewed:   I have personally reviewed following labs and imaging studies:  Labs: Labs show the following:   Basic Metabolic Panel: Recent Labs  Lab 03/03/22 0624 03/04/22 0455 03/05/22 0543 03/06/22 0523  NA 140 145 139 140  K 4.9 3.7 3.6 3.4*  CL 106 112* 110 109  CO2 24 24 24 26   GLUCOSE 296* 138* 152* 72  BUN 32* 33* 26* 21*  CREATININE 1.49* 1.75* 1.52* 1.26*  CALCIUM 9.4 8.7* 8.6* 8.6*   GFR Estimated Creatinine Clearance: 49.1 mL/min (A) (by C-G formula based on SCr of 1.26 mg/dL (H)). Liver Function Tests: Recent Labs  Lab 03/03/22 0624  AST 20  ALT 13  ALKPHOS 63  BILITOT 1.1  PROT 7.9  ALBUMIN 4.5   Recent Labs  Lab 03/03/22 0624  LIPASE 50   No results for input(s): "AMMONIA" in the last 168 hours. Coagulation profile No results for input(s): "INR", "PROTIME" in the last 168 hours.  CBC: Recent Labs  Lab 03/03/22 0624 03/04/22 0455  WBC 6.0 5.6  NEUTROABS 4.7  --   HGB 11.2* 10.1*  HCT 35.7* 31.8*  MCV 85.8 86.4  PLT 249 228   Cardiac Enzymes: No results for input(s): "CKTOTAL", "CKMB", "CKMBINDEX", "TROPONINI" in the last 168 hours. BNP (last 3 results) No results for input(s): "PROBNP" in the last 8760 hours. CBG: Recent Labs  Lab 03/05/22 1648 03/05/22 2001 03/06/22 0458 03/06/22 0752 03/06/22 1149  GLUCAP 103* 168* 83 97 189*   D-Dimer: No results for input(s): "DDIMER" in the last 72 hours. Hgb A1c: Recent Labs    03/03/22 1520  HGBA1C 9.5*   Lipid Profile: Recent Labs    03/04/22 0516  CHOL 256*  HDL 43  LDLCALC 186*  TRIG 136  CHOLHDL 6.0   Thyroid function studies: No results for input(s): "TSH",  "T4TOTAL", "T3FREE", "THYROIDAB" in the last 72 hours.  Invalid input(s): "FREET3" Anemia work up: No results for input(s): "VITAMINB12", "FOLATE", "FERRITIN", "TIBC", "IRON", "RETICCTPCT" in the last 72 hours. Sepsis Labs: Recent Labs  Lab 03/03/22 0624 03/04/22 0455  WBC 6.0 5.6    Microbiology Recent Results (from the past 240 hour(s))  Resp Panel by RT-PCR (Flu A&B, Covid) Anterior Nasal Swab     Status: None   Collection Time: 03/03/22  7:57 AM   Specimen: Anterior Nasal Swab  Result Value Ref Range Status   SARS Coronavirus 2 by RT PCR NEGATIVE NEGATIVE Final    Comment: (NOTE) SARS-CoV-2 target nucleic acids are NOT DETECTED.  The SARS-CoV-2 RNA is generally detectable in upper respiratory specimens during the acute phase of infection. The lowest concentration of SARS-CoV-2 viral copies this assay can detect is 138 copies/mL. A negative result does not preclude SARS-Cov-2 infection and should not be used as the sole basis for treatment or other patient management decisions. A negative result may occur with  improper specimen collection/handling, submission of specimen other than nasopharyngeal swab, presence of viral mutation(s) within the areas targeted by this assay, and inadequate number of viral copies(<138 copies/mL). A negative result must be combined with clinical observations, patient history, and epidemiological information. The expected result is Negative.  Fact Sheet for Patients:  BloggerCourse.com  Fact Sheet for Healthcare Providers:  SeriousBroker.it  This test is no t yet approved or cleared by the [place] FDA and  has been authorized for detection and/or diagnosis of SARS-CoV-2 by FDA under an Emergency Use Authorization (EUA). This EUA will remain  in effect (meaning this test can be used) for the duration of the COVID-19 declaration under Section 564(b)(1) of the Act, 21 U.S.C.section  360bbb-3(b)(1), unless the authorization is terminated  or revoked sooner.       Influenza A by PCR NEGATIVE NEGATIVE Final   Influenza B by PCR NEGATIVE NEGATIVE Final    Comment: (NOTE) The Xpert Xpress SARS-CoV-2/FLU/RSV plus assay is intended as an aid in the diagnosis of influenza from Nasopharyngeal swab specimens and should not be used as a sole basis for treatment. Nasal washings and aspirates are unacceptable for Xpert Xpress SARS-CoV-2/FLU/RSV testing.  Fact Sheet for Patients: BloggerCourse.com  Fact Sheet for Healthcare Providers: SeriousBroker.it  This test is not yet approved or cleared by the [place] FDA and has been authorized for detection and/or diagnosis of SARS-CoV-2 by FDA under an Emergency Use Authorization (EUA). This EUA will remain in effect (meaning this test can be used) for the duration of the COVID-19 declaration under Section 564(b)(1) of the Act, 21 U.S.C. section 360bbb-3(b)(1), unless the authorization is terminated or revoked.  Performed at [facility], [address] [place], [place] [address]     Procedures and diagnostic studies:  No results found.             LOS: 2 days   Dewain Platz  [facility]   Pager on [email or web address]. If 7PM-7AM, please contact night-coverage at [email or web address]     03/06/2022, 2:12 PM

## 2022-03-06 NOTE — Plan of Care (Signed)
  Problem: Health Behavior/Discharge Planning: Goal: Ability to manage health-related needs will improve Outcome: Progressing   Problem: Education: Goal: Knowledge of General Education information will improve Description: Including pain rating scale, medication(s)/side effects and non-pharmacologic comfort measures Outcome: Progressing   Problem: Clinical Measurements: Goal: Ability to maintain clinical measurements within normal limits will improve Outcome: Progressing   Problem: Clinical Measurements: Goal: Will remain free from infection Outcome: Progressing   Problem: Clinical Measurements: Goal: Respiratory complications will improve Outcome: Progressing   Problem: Clinical Measurements: Goal: Cardiovascular complication will be avoided Outcome: Progressing   Problem: Nutrition: Goal: Adequate nutrition will be maintained Outcome: Progressing   Problem: Pain Managment: Goal: General experience of comfort will improve Outcome: Progressing   Problem: Safety: Goal: Ability to remain free from injury will improve Outcome: Progressing   Problem: Skin Integrity: Goal: Risk for impaired skin integrity will decrease Outcome: Progressing   Problem: Coping: Goal: Ability to adjust to condition or change in health will improve Outcome: Progressing   Problem: Metabolic: Goal: Ability to maintain appropriate glucose levels will improve Outcome: Progressing   Problem: Nutritional: Goal: Maintenance of adequate nutrition will improve Outcome: Progressing   Problem: Nutritional: Goal: Progress toward achieving an optimal weight will improve Outcome: Progressing   Problem: Skin Integrity: Goal: Risk for impaired skin integrity will decrease Outcome: Progressing

## 2022-03-07 DIAGNOSIS — I10 Essential (primary) hypertension: Secondary | ICD-10-CM | POA: Diagnosis not present

## 2022-03-07 DIAGNOSIS — N179 Acute kidney failure, unspecified: Secondary | ICD-10-CM | POA: Diagnosis not present

## 2022-03-07 DIAGNOSIS — Z8673 Personal history of transient ischemic attack (TIA), and cerebral infarction without residual deficits: Secondary | ICD-10-CM | POA: Diagnosis not present

## 2022-03-07 DIAGNOSIS — R42 Dizziness and giddiness: Secondary | ICD-10-CM | POA: Diagnosis not present

## 2022-03-07 LAB — GLUCOSE, CAPILLARY: Glucose-Capillary: 89 mg/dL (ref 70–99)

## 2022-03-07 LAB — BASIC METABOLIC PANEL
Anion gap: 5 (ref 5–15)
BUN: 34 mg/dL — ABNORMAL HIGH (ref 6–20)
CO2: 23 mmol/L (ref 22–32)
Calcium: 8.4 mg/dL — ABNORMAL LOW (ref 8.9–10.3)
Chloride: 111 mmol/L (ref 98–111)
Creatinine, Ser: 1.4 mg/dL — ABNORMAL HIGH (ref 0.61–1.24)
GFR, Estimated: >60 mL/min (ref >60)
Glucose, Bld: 233 mg/dL — ABNORMAL HIGH (ref 70–99)
Potassium: 4.4 mmol/L (ref 3.5–5.1)
Sodium: 139 mmol/L (ref 135–145)

## 2022-03-07 MED ORDER — HYDROCHLOROTHIAZIDE 12.5 MG PO TABS
12.5000 mg | ORAL_TABLET | Freq: Every day | ORAL | Status: DC
  Administered 2022-03-07: 12.5 mg via ORAL
  Filled 2022-03-07: qty 1

## 2022-03-07 MED ORDER — LISINOPRIL 20 MG PO TABS
40.0000 mg | ORAL_TABLET | Freq: Every day | ORAL | Status: DC
  Administered 2022-03-07: 40 mg via ORAL
  Filled 2022-03-07: qty 2

## 2022-03-07 NOTE — Discharge Summary (Signed)
Physician Discharge Summary   Patient: Adrian Taylor MRN: [id number] DOB: 1971-01-29  Admit date:     03/03/2022  Discharge date: 03/07/2022  Discharge Physician: Jennye Boroughs   PCP: [facility]   Recommendations at discharge:   Follow-up with PCP in 1 week for repeat labs  Discharge Diagnoses: Principal Problem:   Dizziness Active Problems:   History of CVA (cerebrovascular accident)   AKI (acute kidney injury) ([place])   Hypertension   Diabetes mellitus without complication ([place])  Resolved Problems:   * No resolved hospital problems. *  Hospital Course:  Adrian Taylor is a 51 y.o. male with medical history significant for prior CVA with right-sided hemiparesis, diabetes mellitus, hypertension, alcohol use disorder, who presented to the hospital because of nausea, chills, vomiting and dizziness.  He had difficulty ambulating because of dizziness.  Dizziness improved at rest or when he is laying down but it's worse with movement.   MRI did not show any evidence of acute stroke.  He was found to have acute kidney injury.  He was treated with IV fluids.  HCTZ and lisinopril were held because of AKI.  His creatinine was 0.87 on 11/15/2020.  His creatinine at the time of discharge was 1.40.  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  The importance of adequate glucose control, BP control and medical adherence was reiterated since this will help delay the progression of chronic kidney disease.  His dizziness has improved.  He has been able to ambulate by himself without any problems.  He did not use any assistive device.  He is deemed stable for discharge to home today.  Discharge plan was discussed with the patient and his wife at the bedside using the iPad Spanish interpretation service.     Consultants: None Procedures performed: None   Disposition: Home Diet recommendation:  Discharge Diet Orders (From admission, onward)     Start     Ordered   03/07/22 0000  Diet - low sodium heart healthy        03/07/22 1010   03/07/22 0000  Diet Carb Modified        03/07/22 1010           Cardiac and Carb modified diet DISCHARGE MEDICATION: Allergies as of 03/07/2022       Reactions   Insulin Aspart Itching   Pt states not allergic  Tolerates Lantus Pt states not allergic         Medication List     STOP taking these medications    tamsulosin 0.4 MG Caps capsule Commonly known as: FLOMAX   Vitamin D (Ergocalciferol) 1.25 MG (50000 UNIT) Caps capsule Commonly known as: DRISDOL       TAKE these medications    acetaminophen 500 MG tablet Commonly known as: TYLENOL Take 500-1,000 mg by mouth every 6 (six) hours as needed for mild pain or moderate pain.   empagliflozin 10 MG Tabs tablet Commonly known as: JARDIANCE Take 10 mg by mouth daily.   hydrochlorothiazide 25 MG tablet Commonly known as: HYDRODIURIL Take 0.5 tablets (12.5 mg total) by mouth daily.   insulin glargine 100 UNIT/ML injection Commonly known as: LANTUS Inject 15 Units into the skin at bedtime. What changed: Another medication with the same name was removed. Continue taking this medication, and follow the directions you see here.   lisinopril 40 MG tablet Commonly known as:  ZESTRIL Take 40 mg by mouth daily.   metFORMIN 500 MG tablet Commonly known as: GLUCOPHAGE Take 1,000 mg by mouth 2 (two) times daily.   omeprazole 20 MG capsule Commonly known as: PRILOSEC Take 20 mg by mouth daily.   simvastatin 40 MG tablet Commonly known as: ZOCOR Take 40 mg by mouth daily.        Jarrell, Martin In 2 days.   Specialty: General Practice Contact information: [address] [place] [address] [phone]         [facility]  EMERGENCY DEPARTMENT.   Specialty: Emergency Medicine Why: If symptoms worsen Contact information: [place] [id number] [address] [place] [phone]               Discharge Exam: Danley Danker Weights   03/03/22 0612  Weight: 54.4 kg   GEN: NAD SKIN: Warm and dry EYES: EOMI ENT: MMM CV: RRR PULM: CTA B ABD: soft, ND, NT, +BS CNS: AAO x 3, non focal EXT: No edema or tenderness   Condition at discharge: good  The results of significant diagnostics from this hospitalization (including imaging, microbiology, ancillary and laboratory) are listed below for reference.   Imaging Studies: CT ANGIO HEAD NECK W WO CM  Result Date: 03/03/2022 CLINICAL DATA:  Dizziness, persistent/recurrent, cardiac or vascular cause suspected. EXAM: CT ANGIOGRAPHY HEAD AND NECK TECHNIQUE: Multidetector CT imaging of the head and neck was performed using the standard protocol during bolus administration of intravenous contrast. Multiplanar CT image reconstructions and MIPs were obtained to evaluate the vascular anatomy. Carotid stenosis measurements (when applicable) are obtained utilizing NASCET criteria, using the distal internal carotid diameter as the denominator. RADIATION DOSE REDUCTION: This exam was performed according to the departmental dose-optimization program which includes automated exposure control, adjustment of the mA and/or kV according to patient size and/or use of iterative reconstruction technique. CONTRAST:  30mL OMNIPAQUE IOHEXOL 350 MG/ML SOLN COMPARISON:  Head MRI 03/03/2022 FINDINGS: CT HEAD FINDINGS Brain: There is no evidence of an acute infarct, intracranial hemorrhage, mass, midline shift, or extra-axial fluid collection. Encephalomalacia is again noted in the left external capsule and corona radiata. Cerebral atrophy is moderately advanced for age. Vascular: Calcified atherosclerosis at the skull base. Skull: No fracture or suspicious osseous lesion.  Sinuses/Orbits: Mild mucosal thickening in the paranasal sinuses. Clear mastoid air cells. Unremarkable orbits. Other: None. Review of the MIP images confirms the above findings CTA NECK FINDINGS Aortic arch: Standard 3 vessel aortic arch with widely patent arch vessel origins. Right carotid system: Patent with a moderate amount of eccentric, calcified plaque at the carotid bifurcation. No evidence of a significant stenosis or dissection. Left carotid system: Patent with a small to moderate amount of predominantly calcified plaque at the carotid bifurcation. No evidence of a significant stenosis or dissection. Retropharyngeal course of the mid cervical ICA. Vertebral arteries: Patent and codominant without evidence of a significant stenosis or dissection. Skeleton: No suspicious osseous lesion. Other neck: No evidence of cervical lymphadenopathy or mass. Upper chest: No apical lung consolidation. Review of the MIP images confirms the above findings CTA HEAD FINDINGS Anterior circulation: The internal carotid arteries are patent from skull base to carotid termini with siphon atherosclerosis resulting in up to mild stenosis bilaterally. ACAs and MCAs are patent without evidence of a proximal branch occlusion or significant proximal stenosis. No aneurysm is identified. Posterior circulation: The intracranial vertebral arteries are patent to the  basilar with mild atherosclerotic narrowing bilaterally, left greater than right. Patent right PICA, left AICA, and bilateral SCA origins are visualized. The basilar artery is widely patent. Posterior communicating arteries are diminutive or absent. Both PCAs are patent without evidence of a significant proximal stenosis on the left. There are tandem severe right P2 stenoses. No aneurysm is identified. Venous sinuses: Patent. Anatomic variants: None. Review of the MIP images confirms the above findings IMPRESSION: 1. Intracranial atherosclerosis with severe right P2 and mild  bilateral ICA stenoses. 2. Moderate cervical carotid artery atherosclerosis without significant stenosis. 3. Patent vertebral arteries with mild V4 stenoses. Electronically Signed   By: Logan Bores M.D.   On: 03/03/2022 13:54   MR BRAIN WO CONTRAST  Result Date: 03/03/2022 CLINICAL DATA:  51 year old male with persistent dizziness. Nausea vomiting. Weakness. EXAM: MRI HEAD WITHOUT CONTRAST TECHNIQUE: Multiplanar, multiecho pulse sequences of the brain and surrounding structures were obtained without intravenous contrast. COMPARISON:  None Available. FINDINGS: Brain: Cerebral volume loss for age, appears fairly generalized. No restricted diffusion to suggest acute infarction. No midline shift, mass effect, evidence of mass lesion, ventriculomegaly, extra-axial collection or acute intracranial hemorrhage. Cervicomedullary junction and pituitary are within normal limits. Chronic white matter encephalomalacia tracking along the left external capsule with dense associated hemosiderin, such as from previous hemorrhagic white matter infarct or prior small-vessel hemorrhage. Some of the posterior left corona radiata is affected. No other chronic cerebral blood products identified. No cortical encephalomalacia identified. Suspicion of a small chronic lacunar infarct of the lateral right thalamus on series 10, image 12. But otherwise deep gray nuclei, brainstem, and cerebellum signal remains normal. Vascular: Major intracranial vascular flow voids are preserved. Skull and upper cervical spine: Negative visible cervical spine. Visualized bone marrow signal is within normal limits. Sinuses/Orbits: Negative orbits. Mild paranasal sinus mucosal thickening, primarily the right maxillary sinus. No sinus fluid level. Other: Mastoids are clear. Visible internal auditory structures appear normal. Negative visible scalp and face. IMPRESSION: 1. No acute intracranial abnormality. 2. But there is a chronic hemorrhagic infarct or  remote small vessel hemorrhage of the deep left frontal lobe white matter (left external capsule and corona radiata). Possible small chronic lacunar infarct of the right thalamus. And nonspecific generalized cerebral volume loss for age. Electronically Signed   By: Genevie Ann M.D.   On: 03/03/2022 10:57    Microbiology: Results for orders placed or performed during the hospital encounter of 03/03/22  Resp Panel by RT-PCR (Flu A&B, Covid) Anterior Nasal Swab     Status: None   Collection Time: 03/03/22  7:57 AM   Specimen: Anterior Nasal Swab  Result Value Ref Range Status   SARS Coronavirus 2 by RT PCR NEGATIVE NEGATIVE Final    Comment: (NOTE) SARS-CoV-2 target nucleic acids are NOT DETECTED.  The SARS-CoV-2 RNA is generally detectable in upper respiratory specimens during the acute phase of infection. The lowest concentration of SARS-CoV-2 viral copies this assay can detect is 138 copies/mL. A negative result does not preclude SARS-Cov-2 infection and should not be used as the sole basis for treatment or other patient management decisions. A negative result may occur with  improper specimen collection/handling, submission of specimen other than nasopharyngeal swab, presence of viral mutation(s) within the areas targeted by this assay, and inadequate number of viral copies(<138 copies/mL). A negative result must be combined with clinical observations, patient history, and epidemiological information. The expected result is Negative.  Fact Sheet for Patients:  EntrepreneurPulse.com.au  Fact Sheet for Healthcare Providers:  IncredibleEmployment.be  This test is no t yet approved or cleared by the [place] and  has been authorized for detection and/or diagnosis of SARS-CoV-2 by FDA under an Emergency Use Authorization (EUA). This EUA will remain  in effect (meaning this test can be used) for the duration of the COVID-19 declaration under  Section 564(b)(1) of the Act, 21 U.S.C.section 360bbb-3(b)(1), unless the authorization is terminated  or revoked sooner.       Influenza A by PCR NEGATIVE NEGATIVE Final   Influenza B by PCR NEGATIVE NEGATIVE Final    Comment: (NOTE) The Xpert Xpress SARS-CoV-2/FLU/RSV plus assay is intended as an aid in the diagnosis of influenza from Nasopharyngeal swab specimens and should not be used as a sole basis for treatment. Nasal washings and aspirates are unacceptable for Xpert Xpress SARS-CoV-2/FLU/RSV testing.  Fact Sheet for Patients: EntrepreneurPulse.com.au  Fact Sheet for Healthcare Providers: IncredibleEmployment.be  This test is not yet approved or cleared by the [place] FDA and has been authorized for detection and/or diagnosis of SARS-CoV-2 by FDA under an Emergency Use Authorization (EUA). This EUA will remain in effect (meaning this test can be used) for the duration of the COVID-19 declaration under Section 564(b)(1) of the Act, 21 U.S.C. section 360bbb-3(b)(1), unless the authorization is terminated or revoked.  Performed at [facility], [address] [place], [place] [address]     Labs: CBC: Recent Labs  Lab 03/03/22 0624 03/04/22 0455  WBC 6.0 5.6  NEUTROABS 4.7  --   HGB 11.2* 10.1*  HCT 35.7* 31.8*  MCV 85.8 86.4  PLT 249 [id number]   Basic Metabolic Panel: Recent Labs  Lab 03/03/22 0624 03/04/22 0455 03/05/22 0543 03/06/22 0523 03/07/22 0428  NA 140 145 139 140 139  K 4.9 3.7 3.6 3.4* 4.4  CL 106 112* 110 109 111  CO2 24 24 24 26 23   GLUCOSE 296* 138* 152* 72 233*  BUN 32* 33* 26* 21* 34*  CREATININE 1.49* 1.75* 1.52* 1.26* 1.40*  CALCIUM 9.4 8.7* 8.6* 8.6* 8.4*  MG  --   --   --  1.9  --   PHOS  --   --   --  3.9  --    Liver Function Tests: Recent Labs  Lab 03/03/22 0624  AST 20  ALT 13  ALKPHOS 63  BILITOT 1.1  PROT 7.9  ALBUMIN 4.5   CBG: Recent Labs  Lab 03/06/22 0752  03/06/22 1149 03/06/22 1606 03/06/22 2126 03/07/22 0832  GLUCAP 97 189* 164* 116* 89    Discharge time spent: greater than 30 minutes.  Signed: Jennye Boroughs, MD [facility] 03/07/2022

## 2022-03-07 NOTE — TOC Progression Note (Signed)
Transition of Care [facility]) - Progression Note    Patient Details  Name: Adrian Taylor MRN: [id number] Date of Birth: [birth year]  [facility] CM/SW Contact  Izola Price, RN Phone Number: 03/07/2022, 10:34 AM  Clinical Narrative: 10/1: Expected discharge today. PCP with [facility]. To call and schedule a follow up with PCP/Clinic 1-2 weeks for kidney function follow up per discharge orders.  This is also documented in AVS in Spanish.   [facility]  [address]  [place] [address] [phone] [id number],[place], [place] PPO is insurance verified in EMR as of 03/03/22.  Simmie Davies RN CM    Expected Discharge Plan: Home/Self Care Barriers to Discharge: Continued Medical Work up  Expected Discharge Plan and Services Expected Discharge Plan: Home/Self Care         Expected Discharge Date: 03/07/22                                     Social Determinants of Health (SDOH) Interventions Housing Interventions: Patient Refused  Readmission Risk Interventions     No data to display

## 2022-03-07 NOTE — Plan of Care (Signed)
  Problem: Education: Goal: Knowledge of General Education information will improve Description: Including pain rating scale, medication(s)/side effects and non-pharmacologic comfort measures Outcome: Progressing   Problem: Health Behavior/Discharge Planning: Goal: Ability to manage health-related needs will improve Outcome: Progressing   Problem: Clinical Measurements: Goal: Diagnostic test results will improve Outcome: Progressing   Problem: Clinical Measurements: Goal: Respiratory complications will improve Outcome: Progressing   Problem: Clinical Measurements: Goal: Cardiovascular complication will be avoided Outcome: Progressing   Problem: Activity: Goal: Risk for activity intolerance will decrease Outcome: Progressing   Problem: Nutrition: Goal: Adequate nutrition will be maintained Outcome: Progressing   Problem: Pain Managment: Goal: General experience of comfort will improve Outcome: Progressing   Problem: Safety: Goal: Ability to remain free from injury will improve Outcome: Progressing   Problem: Skin Integrity: Goal: Risk for impaired skin integrity will decrease Outcome: Progressing   Problem: Education: Goal: Ability to describe self-care measures that may prevent or decrease complications (Diabetes Survival Skills Education) will improve Outcome: Progressing   Problem: Coping: Goal: Ability to adjust to condition or change in health will improve Outcome: Progressing   Problem: Fluid Volume: Goal: Ability to maintain a balanced intake and output will improve Outcome: Progressing   Problem: Metabolic: Goal: Ability to maintain appropriate glucose levels will improve Outcome: Progressing   Problem: Nutritional: Goal: Maintenance of adequate nutrition will improve Outcome: Progressing   Problem: Skin Integrity: Goal: Risk for impaired skin integrity will decrease Outcome: Progressing   Problem: Tissue Perfusion: Goal: Adequacy of tissue  perfusion will improve Outcome: Progressing

## 2022-06-11 ENCOUNTER — Other Ambulatory Visit: Payer: Self-pay | Admitting: Nephrology

## 2022-06-11 DIAGNOSIS — I10 Essential (primary) hypertension: Secondary | ICD-10-CM

## 2022-06-11 DIAGNOSIS — N1831 Chronic kidney disease, stage 3a: Secondary | ICD-10-CM

## 2022-06-11 DIAGNOSIS — R809 Proteinuria, unspecified: Secondary | ICD-10-CM

## 2022-07-01 ENCOUNTER — Ambulatory Visit
Admission: RE | Admit: 2022-07-01 | Discharge: 2022-07-01 | Disposition: A | Discharge status: Home / Self Care | Payer: BLUE CROSS/BLUE SHIELD | Source: Ambulatory Visit | Attending: Nephrology | Admitting: Nephrology

## 2022-07-01 DIAGNOSIS — I10 Essential (primary) hypertension: Secondary | ICD-10-CM | POA: Diagnosis present

## 2022-07-01 DIAGNOSIS — R809 Proteinuria, unspecified: Secondary | ICD-10-CM

## 2022-07-01 DIAGNOSIS — N1831 Chronic kidney disease, stage 3a: Secondary | ICD-10-CM | POA: Diagnosis not present

## 2022-09-05 IMAGING — DX DG CHEST 1V PORT
1 series · 1 of 1 positions shown · non-contrast
Comparison: None.

CLINICAL DATA: Fever.  Elevated blood sugar.

EXAM:
PORTABLE CHEST 1 VIEW

[chest ap]
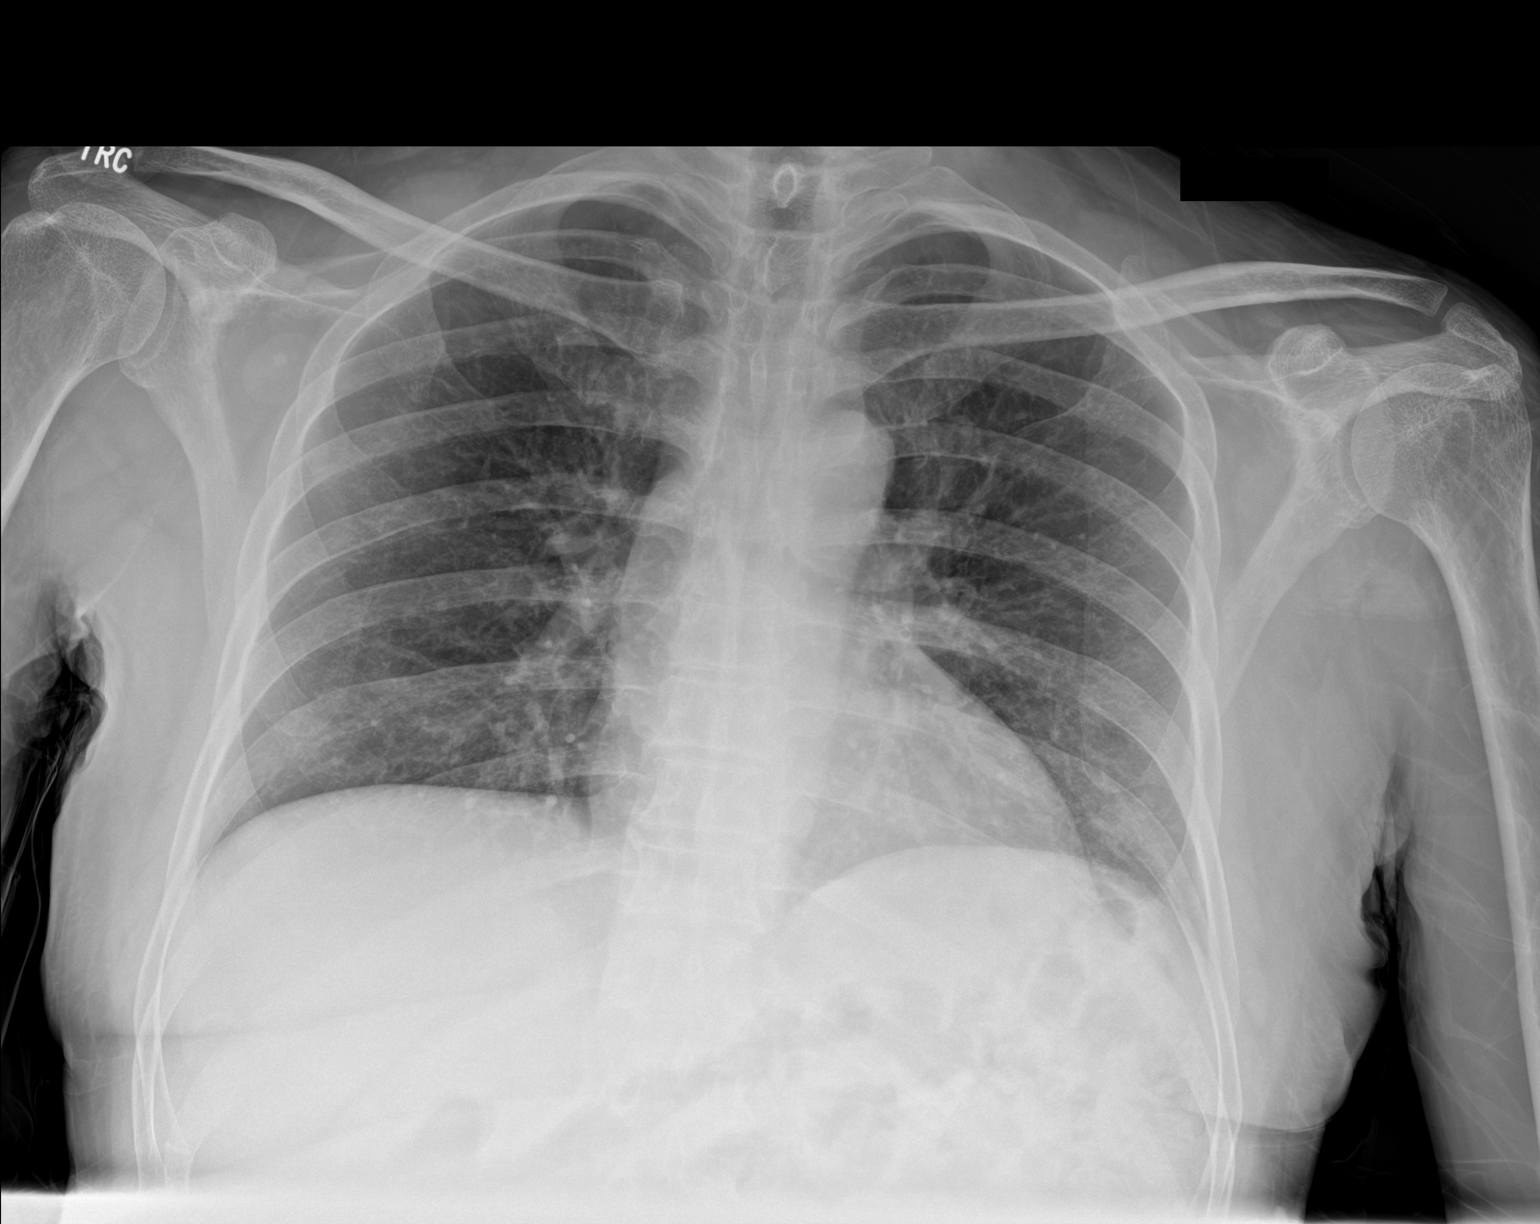

[1 of 1 positions shown; findings below may reference images not displayed]

FINDINGS: The heart size and mediastinal contours are within normal limits.

Low lung volumes. No focal consolidation. No pulmonary edema. No
pleural effusion. No pneumothorax.

No acute osseous abnormality.
IMPRESSION: No active disease.

## 2023-08-30 ENCOUNTER — Ambulatory Visit: Admit: 2023-08-30 | Admitting: Ophthalmology

## 2023-08-30 SURGERY — PHACOEMULSIFICATION, CATARACT, WITH IOL INSERTION
Anesthesia: Topical | Laterality: Left
